# Patient Record
Sex: Female | Born: 1965 | Race: White | Hispanic: No | Marital: Single | Smoking: Former smoker
Health system: Southern US, Community
[De-identification: ages and names within clinical notes are randomized; demographics above are authoritative.]

## PROBLEM LIST (undated history)

## (undated) DIAGNOSIS — M419 Scoliosis, unspecified: Secondary | ICD-10-CM

## (undated) DIAGNOSIS — F1010 Alcohol abuse, uncomplicated: Secondary | ICD-10-CM

## (undated) HISTORY — DX: Scoliosis, unspecified: M41.9

## (undated) HISTORY — PX: SPINAL FUSION: SHX223

## (undated) HISTORY — DX: Alcohol abuse, uncomplicated: F10.10

## (undated) HISTORY — PX: THERAPEUTIC ABORTION: SHX798

---

## 1997-04-11 ENCOUNTER — Ambulatory Visit (HOSPITAL_COMMUNITY): Admission: RE | Admit: 1997-04-11 | Discharge: 1997-04-11 | Payer: Self-pay | Admitting: Gynecology

## 1997-04-20 ENCOUNTER — Inpatient Hospital Stay (HOSPITAL_COMMUNITY): Admission: AD | Admit: 1997-04-20 | Discharge: 1997-04-23 | Payer: Self-pay | Admitting: Gynecology

## 1998-05-06 ENCOUNTER — Other Ambulatory Visit: Admission: RE | Admit: 1998-05-06 | Discharge: 1998-05-06 | Payer: Self-pay | Admitting: Gynecology

## 1999-06-09 ENCOUNTER — Other Ambulatory Visit: Admission: RE | Admit: 1999-06-09 | Discharge: 1999-06-09 | Payer: Self-pay | Admitting: Gynecology

## 2001-03-15 ENCOUNTER — Other Ambulatory Visit: Admission: RE | Admit: 2001-03-15 | Discharge: 2001-03-15 | Payer: Self-pay | Admitting: Gynecology

## 2001-08-09 ENCOUNTER — Encounter (INDEPENDENT_AMBULATORY_CARE_PROVIDER_SITE_OTHER): Payer: Self-pay | Admitting: *Deleted

## 2001-08-09 ENCOUNTER — Ambulatory Visit (HOSPITAL_COMMUNITY): Admission: RE | Admit: 2001-08-09 | Discharge: 2001-08-09 | Payer: Self-pay | Admitting: Gynecology

## 2002-12-04 ENCOUNTER — Encounter: Admission: RE | Admit: 2002-12-04 | Discharge: 2002-12-04 | Payer: Self-pay | Admitting: Gynecology

## 2004-05-16 ENCOUNTER — Emergency Department (HOSPITAL_COMMUNITY): Admission: EM | Admit: 2004-05-16 | Discharge: 2004-05-16 | Payer: Self-pay | Admitting: Family Medicine

## 2005-09-16 ENCOUNTER — Other Ambulatory Visit: Admission: RE | Admit: 2005-09-16 | Discharge: 2005-09-16 | Payer: Self-pay | Admitting: Gynecology

## 2006-10-04 ENCOUNTER — Emergency Department (HOSPITAL_COMMUNITY): Admission: EM | Admit: 2006-10-04 | Discharge: 2006-10-05 | Payer: Self-pay | Admitting: Emergency Medicine

## 2006-10-05 ENCOUNTER — Inpatient Hospital Stay (HOSPITAL_COMMUNITY): Admission: AD | Admit: 2006-10-05 | Discharge: 2006-10-10 | Payer: Self-pay | Admitting: Psychiatry

## 2006-10-05 ENCOUNTER — Ambulatory Visit: Payer: Self-pay | Admitting: Psychiatry

## 2006-12-30 ENCOUNTER — Emergency Department (HOSPITAL_COMMUNITY): Admission: EM | Admit: 2006-12-30 | Discharge: 2006-12-31 | Payer: Self-pay | Admitting: Emergency Medicine

## 2010-05-21 NOTE — Discharge Summary (Signed)
Jean Palmer, Jean Palmer                 ACCOUNT NO.:  [id number]   MEDICAL RECORD NO.:  [id number]          PATIENT TYPE:  IPS   LOCATION:  0502                          FACILITY:  [facility]   PHYSICIAN:  Anselm Jungling, MD  DATE OF BIRTH:  1965/02/06   DATE OF ADMISSION:  10/05/2006  DATE OF DISCHARGE:  10/10/2006                               DISCHARGE SUMMARY   IDENTIFYING DATA/REASON FOR ADMISSION:  This was an inpatient  psychiatric admission for [name], a 45 year old Caucasian female who was  admitted to the inpatient psychiatric service due to increasing  depression, anxiety, hopelessness, and a lot of drinking.  She  described heavy maintenance-type drinking during the past month or so.  She was not involved in any treatment at the time of admission.  She  denied drug abuse.  She had never had any prior chemical dependency  treatment nor inpatient psychiatric treatment.   MEDICAL/LABORATORY:  The patient was medically and physically assessed  by the psychiatric nurse practitioner.  She was in good health without  any active or chronic medical problems.   HOSPITAL COURSE:  The patient was admitted to the adult inpatient  psychiatric service.  She presented as a well-nourished, well-developed  woman who was alert, fully oriented, pleasant, sad, tearful, and  tremulous.  There were no signs or symptoms of psychosis or thought  disorder and no active suicidal ideation.  She verbalized a strong  desire for help.   INITIAL DIAGNOSTIC IMPRESSION:  She was given an initial AXIS I  diagnosis of alcohol dependence, and substance-induced mood disorder.   The patient was involved in the therapeutic milieu and placed on a  Librium detoxification protocol, as it was felt that she was entering  alcohol withdrawal.  She participated in therapeutic groups and  activities including those geared towards 12-step recovery.  She was a  good participant in the treatment program.  Over the following  three  days, she felt better, and the Librium protocol appeared to adequately  address her withdrawal symptoms.   On the sixth hospital day, the patient appeared to be appropriate for  discharge.  We had endeavored to have a family meeting with the patient  and her father prior to discharge, but due to communication mishaps,  this was not able to occur, and the patient elected to be discharged  before it could be rescheduled.   AFTERCARE:  The patient was to follow up with Dr. Marlyne Beards with an  appointment on October 23, 2006.   DISCHARGE MEDICATIONS:  Lexapro 10 mg daily.   DISCHARGE DIAGNOSES:  AXIS I:  Alcohol dependence, early remission.  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  AXIS II:  Deferred.  AXIS III:  No acute or chronic illnesses.  AXIS IV:  Stressors:  Severe.  AXIS V:  GAF on discharge 60.      Anselm Jungling, MD  Electronically Signed     SPB/MEDQ  D:  10/12/2006  T:  10/13/2006  Job:  [id number]

## 2010-05-21 NOTE — Op Note (Signed)
NAME:  Jean Palmer, Jean Palmer                           ACCOUNT NO.:  [id number]   MEDICAL RECORD NO.:  [id number]                   PATIENT TYPE:  AMB   LOCATION:  SDC                                  FACILITY:  [facility]   PHYSICIAN:  Ivor Costa. Farrel Gobble, M.D.              DATE OF BIRTH:  Sep 29, 1965   DATE OF PROCEDURE:  08/09/2001  DATE OF DISCHARGE:                                 OPERATIVE REPORT   PREOPERATIVE DIAGNOSIS:  Undesired pregnancy.   POSTOPERATIVE DIAGNOSIS:  Undesired pregnancy.   PROCEDURE:  Second trimester dilatation and evacuation.   SURGEON:  Ivor Costa. Farrel Gobble, M.D.   ANESTHESIA:  General.   ESTIMATED BLOOD LOSS:  500 cc.   FINDINGS:  Fetal parts identified include calvarium, torso, spine, ribs,  arms, legs, hands, feet.   SPECIMENS:  Pathology was uterine contents.   DESCRIPTION OF PROCEDURE:  The patient was taken to the operating room,  where general anesthesia was induced.  She was placed in the dorsal  lithotomy position, prepped and draped in the usual sterile fashion.  A  sterile weighted speculum was placed in the vagina and the cervix was  visualized and stabilized with a single-tooth tenaculum after bimanual  examination.  Paracervical block was placed with 10 cc of 0.25% Marcaine  solution.  The cervix was then dilated up to a #51 [place], after which a 16  suction curette was advanced through the cervix and an amniotomy was  performed, after which the __________  clamp was advanced through the cervix  and the infant was delivered in a stepwise fashion with careful attention to  identifying parts.  In between, the suction was used to bring the infant  into the lower segment, and the uterus was cleared of all its contents.  Sharp curettage was performed, which revealed a scant amount of what may  have been placental tissue.  The suction was repeatedly advanced back  through the cervix to the point where we no longer had any return.  The  patient then received  Methergine, the uterus clamped down, and there was no  evidence of bleeding after the procedure.  The uterus was approximately 10  weeks' size at the end of the procedure.  All instruments were removed from  the vagina.  Again we re-identified all the parts.  The contents from  suction will also be sent to pathology.  The patient tolerated the procedure  well with sponge, lap, and needle counts correct x2.  She was transferred to  the PACU in stable condition.                                               Ivor Costa. Farrel Gobble, M.D.    Leda Roys  D:  08/09/2001  T:  08/11/2001  Job:  [id number]

## 2010-05-21 NOTE — H&P (Signed)
NAME:  Jean Palmer, Jean Palmer                           ACCOUNT NO.:  [id number]   MEDICAL RECORD NO.:  [id number]                   PATIENT TYPE:  AMB   LOCATION:  SDC                                  [facility]   PHYSICIAN:  Ivor Costa. Farrel Gobble, M.D.              DATE OF BIRTH:  [birth year]   DATE OF ADMISSION:  08/09/2001  DATE OF DISCHARGE:                                HISTORY & PHYSICAL   CHIEF COMPLAINT:  Undesired pregnancy.   HISTORY OF PRESENT ILLNESS:  The patient is a 45 year old G2, P1 with a long-  standing history of oligomenorrhea potentially going close to a year without  a period.  Based on that we had recommended that the patient have an  endometrial biopsy for history of long-standing amenorrhea.  She had stopped  her birth control pills.  They were using condoms and withdrawal for  contraception.  Her husband is planning on getting a vasectomy.  Her LMP was  February 2003.  Prior to getting an endometrial biopsy a urine pregnancy  test was obtained and subsequently came back positive.  The patient had  underwent an ultrasound to date the pregnancy as her uterus felt 14-16 weeks  size and fetal heart tones were auscultated.  The ultrasound showed a 16  week pregnancy with a due date of January 15, 2002, notable for a marginal  previa.  The patient and her husband strongly do not desire this pregnancy.  She has taken multiple medications including diet pills and ibuprofen  throughout the past six months.  In addition, she has resumed smoking and  has drank alcohol.  The patient is reassured that despite these exposures  her risk for any malformation while higher than baseline would still be  relatively small.  However, they elect for termination.   PAST OB/GYN HISTORY:  Severe oligomenorrhea, normal Pap smear.  Conceived  one pregnancy on Clomid stimulation.   PAST MEDICAL HISTORY:  Unremarkable.   PAST SURGICAL HISTORY:  Significant for spinal fusion secondary to  scoliosis.   MEDICATIONS:  Wellbutrin, diet pills.   ALLERGIES:  None.   SOCIAL HISTORY:  Married.  Social alcohol.  Social tobacco.  Regular  exercise.   PHYSICAL EXAMINATION:  GENERAL:  Female markedly upset, but no acute  distress.  HEART:  Regular rate.  LUNGS:  Clear to auscultation.  ABDOMEN:  Soft, nontender.  PELVIC:  The uterus is U -3.  Fetal heart tones were obtained.  Speculum  examination:  Normal appearing cervix.  Bimanual examination:  The uterus  was consistent with 14-16 weeks size.  Adnexal are not palpable.    ASSESSMENT:  A 44 week intrauterine pregnancy for elective termination.  Risks of the procedure were discussed with the patient including the fact  that she would be at increased risk for intrapartum and postpartum bleeding  secondary to marginal previa, increased risk of infection, uterine  perforation, or rupture possibly requiring exploratory surgery.  All  questions were addressed and the patient will present electively on August  7.                                               Ivor Costa. Farrel Gobble, M.D.    Leda Roys  D:  08/08/2001  T:  08/10/2001  Job:  [id number]

## 2010-10-08 LAB — DIFFERENTIAL
Basophils Absolute: 0
Basophils Relative: 0
Eosinophils Absolute: 0
Eosinophils Relative: 0
Lymphocytes Relative: 14
Lymphs Abs: 1.6
Monocytes Absolute: 0.7
Monocytes Relative: 6
Neutro Abs: 9.7 — ABNORMAL HIGH
Neutrophils Relative %: 81 — ABNORMAL HIGH

## 2010-10-08 LAB — URINALYSIS, ROUTINE W REFLEX MICROSCOPIC
Bilirubin Urine: NEGATIVE
Glucose, UA: NEGATIVE
Hgb urine dipstick: NEGATIVE
Ketones, ur: NEGATIVE
Nitrite: NEGATIVE
Protein, ur: NEGATIVE
Specific Gravity, Urine: 1.002 — ABNORMAL LOW
Urobilinogen, UA: 0.2
pH: 6.5

## 2010-10-08 LAB — CBC
HCT: 35.9 — ABNORMAL LOW
Hemoglobin: 12.6
MCHC: 35.1
MCV: 100.8 — ABNORMAL HIGH
Platelets: 304
RBC: 3.56 — ABNORMAL LOW
RDW: 14.3
WBC: 12 — ABNORMAL HIGH

## 2010-10-08 LAB — COMPREHENSIVE METABOLIC PANEL
ALT: 32
AST: 42 — ABNORMAL HIGH
Albumin: 3.8
Alkaline Phosphatase: 47
BUN: 4 — ABNORMAL LOW
CO2: 27
Calcium: 10.3
Chloride: 101
Creatinine, Ser: 0.6
GFR calc Af Amer: >60
GFR calc non Af Amer: >60
Glucose, Bld: 105 — ABNORMAL HIGH
Potassium: 3.9
Sodium: 137
Total Bilirubin: 0.8
Total Protein: 6.7

## 2010-10-08 LAB — PREGNANCY, URINE: Preg Test, Ur: NEGATIVE

## 2010-10-08 LAB — ETHANOL: Alcohol, Ethyl (B): <5

## 2010-10-14 LAB — CBC
HCT: 39.3
Hemoglobin: 13.5
MCHC: 34.4
MCV: 100.2 — ABNORMAL HIGH
Platelets: 308
RBC: 3.92
RDW: 11.9
WBC: 9.4

## 2010-10-14 LAB — RAPID URINE DRUG SCREEN, HOSP PERFORMED
Amphetamines: NOT DETECTED
Barbiturates: NOT DETECTED
Benzodiazepines: NOT DETECTED
Cocaine: NOT DETECTED
Opiates: NOT DETECTED
Tetrahydrocannabinol: NOT DETECTED

## 2010-10-14 LAB — BASIC METABOLIC PANEL
BUN: 7
CO2: 29
Calcium: 9.2
Chloride: 101
Creatinine, Ser: 0.59
GFR calc Af Amer: >60
GFR calc non Af Amer: >60
Glucose, Bld: 90
Potassium: 4.5
Sodium: 139

## 2010-10-14 LAB — DIFFERENTIAL
Basophils Absolute: 0.1
Basophils Relative: 1
Eosinophils Absolute: 0.1
Eosinophils Relative: 1
Lymphocytes Relative: 43
Lymphs Abs: 4 — ABNORMAL HIGH
Monocytes Absolute: 0.5
Monocytes Relative: 5
Neutro Abs: 4.8
Neutrophils Relative %: 50

## 2010-10-14 LAB — TSH: TSH: 1.639

## 2010-10-14 LAB — HEPATIC FUNCTION PANEL
ALT: 27
AST: 32
Albumin: 3.6
Alkaline Phosphatase: 57
Bilirubin, Direct: 0.1
Indirect Bilirubin: 0.8
Total Bilirubin: 0.9
Total Protein: 6.6

## 2010-10-14 LAB — ETHANOL: Alcohol, Ethyl (B): 141 — ABNORMAL HIGH

## 2010-10-14 LAB — PREGNANCY, URINE: Preg Test, Ur: NEGATIVE

## 2019-01-08 ENCOUNTER — Encounter: Payer: Self-pay | Admitting: Gastroenterology

## 2019-01-14 ENCOUNTER — Ambulatory Visit (INDEPENDENT_AMBULATORY_CARE_PROVIDER_SITE_OTHER): Payer: 59

## 2019-01-14 ENCOUNTER — Other Ambulatory Visit: Payer: Self-pay

## 2019-01-14 ENCOUNTER — Encounter: Payer: Self-pay | Admitting: Internal Medicine

## 2019-01-14 ENCOUNTER — Ambulatory Visit (INDEPENDENT_AMBULATORY_CARE_PROVIDER_SITE_OTHER): Payer: 59 | Admitting: Internal Medicine

## 2019-01-14 VITALS — BP 135/86 | HR 93 | Temp 97.2°F | Resp 17 | Ht 62.0 in | Wt 165.0 lb

## 2019-01-14 DIAGNOSIS — R05 Cough: Secondary | ICD-10-CM | POA: Diagnosis not present

## 2019-01-14 DIAGNOSIS — F17200 Nicotine dependence, unspecified, uncomplicated: Secondary | ICD-10-CM

## 2019-01-14 DIAGNOSIS — F17210 Nicotine dependence, cigarettes, uncomplicated: Secondary | ICD-10-CM

## 2019-01-14 DIAGNOSIS — N6324 Unspecified lump in the left breast, lower inner quadrant: Secondary | ICD-10-CM | POA: Diagnosis not present

## 2019-01-14 DIAGNOSIS — N644 Mastodynia: Secondary | ICD-10-CM

## 2019-01-14 DIAGNOSIS — R053 Chronic cough: Secondary | ICD-10-CM

## 2019-01-14 DIAGNOSIS — Z7689 Persons encountering health services in other specified circumstances: Secondary | ICD-10-CM

## 2019-01-14 DIAGNOSIS — Z1231 Encounter for screening mammogram for malignant neoplasm of breast: Secondary | ICD-10-CM

## 2019-01-14 DIAGNOSIS — Z13228 Encounter for screening for other metabolic disorders: Secondary | ICD-10-CM

## 2019-01-14 NOTE — Progress Notes (Signed)
Subjective:    Jean Palmer - 54 y.o. female MRN [id number]  Date of birth: [birth year]  HPI  Jean Palmer is to establish care. Patient has a PMH significant for h/o scoliosis s/p spinal fusion, alcohol abuse--sober for 11 years, tobacco use disorder. Concerned today about left breast tenderness and chronic cough.   Left Breast Tenderness:  Has been occurring for several months. Notices the tenderness in the lower inner quadrant. Nothing seems to make it worse or better. Is not chronic, occurs occasionally. Performs self breast exams and has been concerned about a lump in the area she has tenderness. Has had mammogram in the past which was normal but it has been many years. No known family history of breast cancer.   Chronic Cough: Endorses chronic cough that seems to occur most mornings when first wakes up. This has been occurring for more than 6 months. Is a current smoker, 1/2 ppd. Denies hemoptysis/SOB/wheezing. Her adopted mother passed away from lung cancer and she is very concerned.     Social History   reports that she has been smoking cigarettes. She has been smoking about 0.50 packs per day. She has never used smokeless tobacco.   Family History  She was adopted. Family history is unknown by patient.   Health Maintenance Due  Topic Date Due  . HIV Screening  06/16/1981  . TETANUS/TDAP  06/16/1985  . PAP SMEAR-Modifier  06/17/1987  . MAMMOGRAM  06/16/2016  . COLONOSCOPY  06/16/2016     Review of Systems  Constitutional: Negative for chills, fever, malaise/fatigue and weight loss.  HENT: Negative for congestion, ear pain and sore throat.   Eyes: Negative for blurred vision, discharge and redness.  Respiratory: Positive for cough. Negative for hemoptysis, sputum production, shortness of breath and wheezing.   Cardiovascular: Negative for chest pain, palpitations and leg swelling.  Gastrointestinal: Negative for abdominal pain, constipation, diarrhea, nausea and vomiting.   Genitourinary: Negative for dysuria, frequency, hematuria and urgency.  Musculoskeletal: Negative for back pain and myalgias.  Skin: Negative for rash.  Neurological: Negative for dizziness, weakness and headaches.  Psychiatric/Behavioral: Negative for depression and suicidal ideas. The patient is not nervous/anxious.   All other systems reviewed and are negative.     Past Medical History: Patient Active Problem List   Diagnosis Date Noted  . Tobacco use disorder 01/14/2019    Medications: reviewed and updated   Objective:   Physical Exam BP 135/86   Pulse 93   Temp (!) 97.2 F (36.2 C) (Temporal)   Resp 17   Ht 5\' 2"  (1.575 m)   Wt 165 lb (74.8 kg)   SpO2 98%   BMI 30.18 kg/m  Physical Exam  Constitutional: She is oriented to person, place, and time and well-developed, well-nourished, and in no distress.  HENT:  Head: Normocephalic and atraumatic.  Mouth/Throat: Oropharynx is clear and moist.  Eyes: Pupils are equal, round, and reactive to light. Conjunctivae and EOM are normal.  Neck: No thyromegaly present.  Cardiovascular: Normal rate, regular rhythm, normal heart sounds and intact distal pulses.  No murmur heard. Pulmonary/Chest: Effort normal and breath sounds normal. No respiratory distress. She has no wheezes.  Breasts feel fibrocystic and dense throughout bilaterally. Able to palpate a discrete pea sized nodule in the left inner lower quadrant. The nodule is not fixed and easily mobile. No skin discoloration of the breasts. Nipples appear normal without discharge.   Abdominal: Soft. Bowel sounds are normal. She exhibits no distension. There is  no abdominal tenderness. There is no rebound and no guarding.  Musculoskeletal:        General: No deformity or edema. Normal range of motion.     Cervical back: Normal range of motion and neck supple.  Lymphadenopathy:    She has no cervical adenopathy.  Neurological: She is alert and oriented to person, place, and  time. Gait normal.  Skin: Skin is warm and dry. No rash noted. She is not diaphoretic.  Psychiatric: Mood, affect and judgment normal.  Nursing note and vitals reviewed.       Assessment & Plan:   1. Encounter to establish care Counseled on 150 minutes of exercise per week, healthy eating (including decreased daily intake of saturated fats, cholesterol, added sugars, sodium), STI prevention, routine healthcare maintenance. - HIV antibody (with reflex) -has an appointment for colonoscopy in early Feb  -declines Flu/Tdap today   2. Encounter for screening mammogram for malignant neoplasm of breast Needs mammogram for screening due to age and to follow up on left breast concerns.  - MM Digital Screening; Future  3. Chronic cough Lungs CTAB. Given chronic cough and daily smoker >30 years, will obtain screening chest x-ray. Since cough occurs in the morning, potential this is related to silent reflux. Would consider PPI if CXR negative and cough bothersome to patient.  - DG Chest 2 View  4. Tobacco use disorder Patient currently smokes 1/2 ppd. Spent 5 minutes counseling on dangers of tobacco use and benefits of quitting, offered pharmacological intervention to aid quitting and patient is not ready to quit. Father recently passed away, thinks she will be ready to quit later this year.  - DG Chest 2 View  5. Screening for metabolic disorder - Comprehensive metabolic panel - Lipid Panel     Phill Myron, D.O. 01/14/2019, 4:05 PM [facility] at [facility]

## 2019-01-14 NOTE — Patient Instructions (Signed)
Thanks so much for establishing care today! It was great to meet you!   I have ordered your mammogram. [facility] will receive a call from [facility] to have this scheduled.   Today we got a chest x-ray and some blood work for screening. We will let you know these results in the next 2-3 days.   Take Care,  Dr. Juleen China

## 2019-01-15 LAB — COMPREHENSIVE METABOLIC PANEL
ALT: 29 IU/L (ref 0–32)
AST: 32 IU/L (ref 0–40)
Albumin/Globulin Ratio: 1.6 (ref 1.2–2.2)
Albumin: 4.2 g/dL (ref 3.8–4.9)
Alkaline Phosphatase: 68 IU/L (ref 39–117)
BUN/Creatinine Ratio: 12 (ref 9–23)
BUN: 8 mg/dL (ref 6–24)
Bilirubin Total: 0.2 mg/dL (ref 0.0–1.2)
CO2: 25 mmol/L (ref 20–29)
Calcium: 9.9 mg/dL (ref 8.7–10.2)
Chloride: 104 mmol/L (ref 96–106)
Creatinine, Ser: 0.66 mg/dL (ref 0.57–1.00)
GFR calc Af Amer: 117 mL/min/{1.73_m2} (ref >59)
GFR calc non Af Amer: 102 mL/min/{1.73_m2} (ref >59)
Globulin, Total: 2.7 g/dL (ref 1.5–4.5)
Glucose: 93 mg/dL (ref 65–99)
Potassium: 4.1 mmol/L (ref 3.5–5.2)
Sodium: 141 mmol/L (ref 134–144)
Total Protein: 6.9 g/dL (ref 6.0–8.5)

## 2019-01-15 LAB — LIPID PANEL
Chol/HDL Ratio: 4.6 ratio — ABNORMAL HIGH (ref 0.0–4.4)
Cholesterol, Total: 234 mg/dL — ABNORMAL HIGH (ref 100–199)
HDL: 51 mg/dL (ref >39)
LDL Chol Calc (NIH): 158 mg/dL — ABNORMAL HIGH (ref 0–99)
Triglycerides: 139 mg/dL (ref 0–149)
VLDL Cholesterol Cal: 25 mg/dL (ref 5–40)

## 2019-01-15 LAB — HIV ANTIBODY (ROUTINE TESTING W REFLEX): HIV Screen 4th Generation wRfx: NONREACTIVE

## 2019-01-15 NOTE — Progress Notes (Signed)
Patient notified of results & recommendations. Expressed understanding.

## 2019-01-17 ENCOUNTER — Telehealth: Payer: Self-pay | Admitting: General Practice

## 2019-01-18 ENCOUNTER — Other Ambulatory Visit: Payer: Self-pay | Admitting: Internal Medicine

## 2019-01-18 DIAGNOSIS — N6320 Unspecified lump in the left breast, unspecified quadrant: Secondary | ICD-10-CM

## 2019-01-22 ENCOUNTER — Other Ambulatory Visit: Payer: Self-pay

## 2019-01-22 ENCOUNTER — Ambulatory Visit
Admission: RE | Admit: 2019-01-22 | Discharge: 2019-01-22 | Disposition: A | Discharge status: Home / Self Care | Payer: 59 | Source: Ambulatory Visit | Attending: Internal Medicine | Admitting: Internal Medicine

## 2019-01-22 DIAGNOSIS — N6320 Unspecified lump in the left breast, unspecified quadrant: Secondary | ICD-10-CM

## 2019-02-13 ENCOUNTER — Other Ambulatory Visit: Payer: Self-pay

## 2019-02-13 ENCOUNTER — Ambulatory Visit (AMBULATORY_SURGERY_CENTER): Payer: Self-pay | Admitting: *Deleted

## 2019-02-13 VITALS — Temp 97.7°F | Ht 62.0 in | Wt 132.0 lb

## 2019-02-13 DIAGNOSIS — Z1211 Encounter for screening for malignant neoplasm of colon: Secondary | ICD-10-CM

## 2019-02-13 DIAGNOSIS — Z01818 Encounter for other preprocedural examination: Secondary | ICD-10-CM

## 2019-02-13 MED ORDER — NA SULFATE-K SULFATE-MG SULF 17.5-3.13-1.6 GM/177ML PO SOLN: 1.0000 | Freq: Once | ORAL | 0 refills | Status: AC

## 2019-02-13 NOTE — Progress Notes (Signed)

## 2019-02-20 ENCOUNTER — Other Ambulatory Visit: Payer: Self-pay | Admitting: Gastroenterology

## 2019-02-22 ENCOUNTER — Ambulatory Visit: Payer: 59 | Admitting: Internal Medicine

## 2019-02-22 ENCOUNTER — Other Ambulatory Visit: Payer: Self-pay | Admitting: Gastroenterology

## 2019-02-22 ENCOUNTER — Telehealth: Payer: Self-pay | Admitting: *Deleted

## 2019-02-22 LAB — SARS CORONAVIRUS 2 (TAT 6-24 HRS): SARS Coronavirus 2: INVALID

## 2019-02-22 NOTE — Telephone Encounter (Signed)
Called pt. To inform her that she needed to be rechecked for covid,she agreed to go and have test redone.

## 2019-02-25 ENCOUNTER — Other Ambulatory Visit: Payer: Self-pay

## 2019-02-25 ENCOUNTER — Encounter: Payer: Self-pay | Admitting: Gastroenterology

## 2019-02-25 ENCOUNTER — Ambulatory Visit (AMBULATORY_SURGERY_CENTER): Payer: 59 | Admitting: Gastroenterology

## 2019-02-25 VITALS — BP 109/66 | HR 72 | Temp 96.6°F | Resp 14 | Ht 62.0 in | Wt 132.0 lb

## 2019-02-25 DIAGNOSIS — K635 Polyp of colon: Secondary | ICD-10-CM | POA: Diagnosis not present

## 2019-02-25 DIAGNOSIS — Z1211 Encounter for screening for malignant neoplasm of colon: Secondary | ICD-10-CM

## 2019-02-25 DIAGNOSIS — D125 Benign neoplasm of sigmoid colon: Secondary | ICD-10-CM

## 2019-02-25 DIAGNOSIS — D123 Benign neoplasm of transverse colon: Secondary | ICD-10-CM

## 2019-02-25 MED ORDER — SODIUM CHLORIDE 0.9 % IV SOLN: 500.0000 mL | Freq: Once | INTRAVENOUS | Status: DC

## 2019-02-25 NOTE — Op Note (Signed)
[facility] Patient Name: In Shidler Procedure Date: 02/25/2019 10:41 AM MRN: [id number] Endoscopist: Mauri Pole , MD Age: 54 Referring MD:  Date of Birth: 19-Aug-1965 Gender: Female Account #: [id number] Procedure:                Colonoscopy Indications:              Screening for colorectal malignant neoplasm Medicines:                Monitored Anesthesia Care Procedure:                Pre-Anesthesia Assessment:                           - Prior to the procedure, a History and Physical                            was performed, and patient medications and                            allergies were reviewed. The patient's tolerance of                            previous anesthesia was also reviewed. The risks                            and benefits of the procedure and the sedation                            options and risks were discussed with the patient.                            All questions were answered, and informed consent                            was obtained. Prior Anticoagulants: The patient has                            taken no previous anticoagulant or antiplatelet                            agents. ASA Grade Assessment: II - A patient with                            mild systemic disease. After reviewing the risks                            and benefits, the patient was deemed in                            satisfactory condition to undergo the procedure.                           After obtaining informed consent, the colonoscope  was passed under direct vision. Throughout the                            procedure, the patient's blood pressure, pulse, and                            oxygen saturations were monitored continuously. The                            Colonoscope was introduced through the anus and                            advanced to the the cecum, identified by                            appendiceal orifice and  ileocecal valve. The                            colonoscopy was performed without difficulty. The                            patient tolerated the procedure well. The quality                            of the bowel preparation was excellent. The                            ileocecal valve, appendiceal orifice, and rectum                            were photographed. Scope In: 10:44:45 AM Scope Out: 11:05:13 AM Scope Withdrawal Time: 0 hours 14 minutes 5 seconds  Total Procedure Duration: 0 hours 20 minutes 28 seconds  Findings:                 The perianal and digital rectal examinations were                            normal.                           Two sessile polyps were found in the sigmoid colon                            and transverse colon. The polyps were 4 to 6 mm in                            size. These polyps were removed with a cold snare.                            Resection and retrieval were complete.                           Two sessile polyps were found in the sigmoid colon  and transverse colon. The polyps were 1 to 2 mm in                            size. These polyps were removed with a cold biopsy                            forceps. Resection and retrieval were complete.                           Non-bleeding internal hemorrhoids were found during                            retroflexion. The hemorrhoids were small. Complications:            No immediate complications. Estimated Blood Loss:     Estimated blood loss was minimal. Impression:               - Two 4 to 6 mm polyps in the sigmoid colon and in                            the transverse colon, removed with a cold snare.                            Resected and retrieved.                           - Two 1 to 2 mm polyps in the sigmoid colon and in                            the transverse colon, removed with a cold biopsy                            forceps. Resected and retrieved.                            - Non-bleeding internal hemorrhoids. Recommendation:           - Patient has a contact number available for                            emergencies. The signs and symptoms of potential                            delayed complications were discussed with the                            patient. Return to normal activities tomorrow.                            Written discharge instructions were provided to the                            patient.                           -  Resume previous diet.                           - Continue present medications.                           - Await pathology results.                           - Repeat colonoscopy in 3 - 5 years for                            surveillance based on pathology results. Mauri Pole, MD 02/25/2019 11:09:24 AM This report has been signed electronically.

## 2019-02-25 NOTE — Patient Instructions (Signed)
Please read handouts provided. Continue present medications. Await pathology results.        YOU HAD AN ENDOSCOPIC PROCEDURE TODAY AT THE [facility]:   Refer to the procedure report that was given to you for any specific questions about what was found during the examination.  If the procedure report does not answer your questions, please call your gastroenterologist to clarify.  If you requested that your care partner not be given the details of your procedure findings, then the procedure report has been included in a sealed envelope for you to review at your convenience later.  YOU SHOULD EXPECT: Some feelings of bloating in the abdomen. Passage of more gas than usual.  Walking can help get rid of the air that was put into your GI tract during the procedure and reduce the bloating. If you had a lower endoscopy (such as a colonoscopy or flexible sigmoidoscopy) you may notice spotting of blood in your stool or on the toilet paper. If you underwent a bowel prep for your procedure, you may not have a normal bowel movement for a few days.  Please Note:  You might notice some irritation and congestion in your nose or some drainage.  This is from the oxygen used during your procedure.  There is no need for concern and it should clear up in a day or so.  SYMPTOMS TO REPORT IMMEDIATELY:   Following lower endoscopy (colonoscopy or flexible sigmoidoscopy):  Excessive amounts of blood in the stool  Significant tenderness or worsening of abdominal pains  Swelling of the abdomen that is new, acute  Fever of 100F or higher    For urgent or emergent issues, a gastroenterologist can be reached at any hour by calling [phone].   DIET:  We do recommend a small meal at first, but then you may proceed to your regular diet.  Drink plenty of fluids but you should avoid alcoholic beverages for 24 hours.  ACTIVITY:  You should plan to take it easy for the rest of today and you should NOT  DRIVE or use heavy machinery until tomorrow (because of the sedation medicines used during the test).    FOLLOW UP: Our staff will call the number listed on your records 48-72 hours following your procedure to check on you and address any questions or concerns that you may have regarding the information given to you following your procedure. If we do not reach you, we will leave a message.  We will attempt to reach you two times.  During this call, we will ask if you have developed any symptoms of COVID 19. If you develop any symptoms (ie: fever, flu-like symptoms, shortness of breath, cough etc.) before then, please call [phone].  If you test positive for Covid 19 in the 2 weeks post procedure, please call and report this information to us.    If any biopsies were taken you will be contacted by phone or by letter within the next 1-3 weeks.  Please call us at [phone] if you have not heard about the biopsies in 3 weeks.    SIGNATURES/CONFIDENTIALITY: You and/or your care partner have signed paperwork which will be entered into your electronic medical record.  These signatures attest to the fact that that the information above on your After Visit Summary has been reviewed and is understood.  Full responsibility of the confidentiality of this discharge information lies with you and/or your care-partner. 

## 2019-02-25 NOTE — Progress Notes (Signed)
Pt's states no medical or surgical changes since previsit or office visit.  Temp taken by JR VS taken by DT

## 2019-02-25 NOTE — Progress Notes (Signed)
PT taken to PACU. Monitors in place. VSS. Report given to RN. 

## 2019-02-25 NOTE — Progress Notes (Signed)
Called to room to assist during endoscopic procedure.  Patient ID and intended procedure confirmed with present staff. Received instructions for my participation in the procedure from the performing physician.  

## 2019-02-26 LAB — SARS CORONAVIRUS 2 (TAT 6-24 HRS): SARS Coronavirus 2: NEGATIVE

## 2019-02-27 ENCOUNTER — Telehealth: Payer: Self-pay | Admitting: *Deleted

## 2019-02-27 NOTE — Telephone Encounter (Signed)
  Follow up Call-  Call back number 02/25/2019  Post procedure Call Back phone  # [phone]  Permission to leave phone message Yes  Some recent data might be hidden     Patient questions:  Do you have a fever, pain , or abdominal swelling? No. Pain Score  0 *  Have you tolerated food without any problems? Yes.    Have you been able to return to your normal activities? Yes.    Do you have any questions about your discharge instructions: Diet   No. Medications  No. Follow up visit  No.  Do you have questions or concerns about your Care? No.  Actions: * If pain score is 4 or above: No action needed, pain <4.  1. Have you developed a fever since your procedure? NO  2.   Have you had an respiratory symptoms (SOB or cough) since your procedure? NO  3.   Have you tested positive for COVID 19 since your procedure NO  4.   Have you had any family members/close contacts diagnosed with the COVID 19 since your procedure?  NO   If yes to any of these questions please route to Joylene John, RN and Alphonsa Gin, RN.

## 2019-03-05 ENCOUNTER — Telehealth: Payer: Self-pay

## 2019-03-05 NOTE — Telephone Encounter (Signed)

## 2019-03-06 ENCOUNTER — Other Ambulatory Visit: Payer: Self-pay

## 2019-03-06 ENCOUNTER — Other Ambulatory Visit (HOSPITAL_COMMUNITY)
Admission: RE | Admit: 2019-03-06 | Discharge: 2019-03-06 | Disposition: A | Discharge status: Home / Self Care | Payer: 59 | Source: Ambulatory Visit | Attending: Internal Medicine | Admitting: Internal Medicine

## 2019-03-06 ENCOUNTER — Encounter: Payer: Self-pay | Admitting: Internal Medicine

## 2019-03-06 ENCOUNTER — Ambulatory Visit (INDEPENDENT_AMBULATORY_CARE_PROVIDER_SITE_OTHER): Payer: 59 | Admitting: Internal Medicine

## 2019-03-06 VITALS — BP 115/78 | HR 91 | Temp 98.2°F | Resp 17 | Ht 62.0 in | Wt 135.0 lb

## 2019-03-06 DIAGNOSIS — Z113 Encounter for screening for infections with a predominantly sexual mode of transmission: Secondary | ICD-10-CM

## 2019-03-06 DIAGNOSIS — Z975 Presence of (intrauterine) contraceptive device: Secondary | ICD-10-CM | POA: Diagnosis not present

## 2019-03-06 DIAGNOSIS — Z780 Asymptomatic menopausal state: Secondary | ICD-10-CM | POA: Insufficient documentation

## 2019-03-06 DIAGNOSIS — Z124 Encounter for screening for malignant neoplasm of cervix: Secondary | ICD-10-CM | POA: Insufficient documentation

## 2019-03-06 DIAGNOSIS — Z1151 Encounter for screening for human papillomavirus (HPV): Secondary | ICD-10-CM | POA: Insufficient documentation

## 2019-03-06 DIAGNOSIS — E785 Hyperlipidemia, unspecified: Secondary | ICD-10-CM | POA: Diagnosis not present

## 2019-03-06 NOTE — Progress Notes (Signed)
  Subjective:    Jean Palmer - 54 y.o. female MRN [id number]  Date of birth: 1965-02-17  HPI  Jean Palmer is here for PAP smear. She reports history of abnormal PAP in past with cryotherapy done. Has not had a PAP for several years. Also reports she had a Mirena IUD put in around 2006 for contraception and never had it removed. Denies vaginal discharge, pelvic pain.      Health Maintenance:  Health Maintenance Due  Topic Date Due  . PAP SMEAR-Modifier  06/16/1996    -  reports that she has been smoking cigarettes. She has been smoking about 0.50 packs per day. She has never used smokeless tobacco. - Review of Systems: Per HPI. - Past Medical History: Patient Active Problem List   Diagnosis Date Noted  . Tobacco use disorder 01/14/2019   - Medications: reviewed and updated   Objective:   Physical Exam BP 115/78   Pulse 91   Temp 98.2 F (36.8 C) (Temporal)   Resp 17   Ht 5\' 2"  (1.575 m)   Wt 135 lb (61.2 kg)   SpO2 98%   BMI 24.69 kg/m  Physical Exam  Constitutional: She is oriented to person, place, and time and well-developed, well-nourished, and in no distress. No distress.  Cardiovascular: Normal rate.  Pulmonary/Chest: Effort normal. No respiratory distress.  Genitourinary:    Genitourinary Comments: Exam performed in the presence of a chaperone. External genitalia within normal limits.  Vaginal mucosa pink, moist, normal rugae.  Nonfriable cervix without lesions, no discharge or bleeding noted on speculum exam. No IUD strings visualized. Bimanual exam revealed normal, nongravid uterus.  No cervical motion tenderness. No adnexal masses bilaterally.      Musculoskeletal:        General: Normal range of motion.  Neurological: She is alert and oriented to person, place, and time.  Skin: Skin is warm and dry. She is not diaphoretic.  Psychiatric: Affect and judgment normal.           Assessment & Plan:   1. Pap smear for cervical cancer screening -  Cytology - PAP([facility])  2. IUD (intrauterine device) in place Placed around 2006 per patient report. Unable to visualize strings on exam. Will obtain imaging.  - US PELVIC COMPLETE WITH TRANSVAGINAL; Future  3. Screening for STD (sexually transmitted disease) Declines HIV/RPR.  - Cervicovaginal ancillary only  4. Hyperlipidemia, unspecified hyperlipidemia type Total cholesterol 234. LDL 158. ASCVD risk is 5.8%. Discussed risks and benefits for statin therapy. Advised would lower risk to 4.3% and I do not feel strongly one way or there other about medication with that data. Patient would like to try diet and exercise changes to improve labs prior to initiating medication.    Phill Myron, D.O. 03/06/2019, 11:23 AM Primary Care at [facility]

## 2019-03-06 NOTE — Patient Instructions (Addendum)
The number to central scheduling is [phone]  Pap Test Why am I having this test? A Pap test, also called a Pap smear, is a screening test to check for signs of:  Cancer of the vagina, cervix, and uterus. The cervix is the lower part of the uterus that opens into the vagina.  Infection.  Changes that may be a sign that cancer is developing (precancerous changes). Women need this test on a regular basis. In general, you should have a Pap test every 3 years until you reach menopause or age 19. Women aged 30-60 may choose to have their Pap test done at the same time as an HPV (human papillomavirus) test every 5 years (instead of every 3 years). Your health care provider may recommend having Pap tests more or less often depending on your medical conditions and past Pap test results. What kind of sample is taken?  Your health care provider will collect a sample of cells from the surface of your cervix. This will be done using a small cotton swab, plastic spatula, or brush. This sample is often collected during a pelvic exam, when you are lying on your back on an exam table with feet in footrests (stirrups). In some cases, fluids (secretions) from the cervix or vagina may also be collected. How do I prepare for this test?  Be aware of where you are in your menstrual cycle. If you are menstruating on the day of the test, you may be asked to reschedule.  You may need to reschedule if you have a known vaginal infection on the day of the test.  Follow instructions from your health care provider about: ? Changing or stopping your regular medicines. Some medicines can cause abnormal test results, such as digitalis and tetracycline. ? Avoiding douching or taking a bath the day before or the day of the test. Tell a health care provider about:  Any allergies you have.  All medicines you are taking, including vitamins, herbs, eye drops, creams, and over-the-counter medicines.  Any blood disorders  you have.  Any surgeries you have had.  Any medical conditions you have.  Whether you are pregnant or may be pregnant. How are the results reported? Your test results will be reported as either abnormal or normal. A false-positive result can occur. A false positive is incorrect because it means that a condition is present when it is not. A false-negative result can occur. A false negative is incorrect because it means that a condition is not present when it is. What do the results mean? A normal test result means that you do not have signs of cancer of the vagina, cervix, or uterus. An abnormal result may mean that you have:  Cancer. A Pap test by itself is not enough to diagnose cancer. You will have more tests done in this case.  Precancerous changes in your vagina, cervix, or uterus.  Inflammation of the cervix.  An STD (sexually transmitted disease).  A fungal infection.  A parasite infection. Talk with your health care provider about what your results mean. Questions to ask your health care provider Ask your health care provider, or the department that is doing the test:  When will my results be ready?  How will I get my results?  What are my treatment options?  What other tests do I need?  What are my next steps? Summary  In general, women should have a Pap test every 3 years until they reach menopause or age 70.  Your health care provider will collect a sample of cells from the surface of your cervix. This will be done using a small cotton swab, plastic spatula, or brush.  In some cases, fluids (secretions) from the cervix or vagina may also be collected. This information is not intended to replace advice given to you by your health care provider. Make sure you discuss any questions you have with your health care provider. Document Revised: 08/29/2016 Document Reviewed: 08/29/2016 Elsevier Patient Education  [place].

## 2019-03-07 ENCOUNTER — Encounter: Payer: Self-pay | Admitting: Gastroenterology

## 2019-03-07 LAB — CERVICOVAGINAL ANCILLARY ONLY
Bacterial Vaginitis (gardnerella): POSITIVE — AB
Candida Glabrata: NEGATIVE
Candida Vaginitis: NEGATIVE
Chlamydia: NEGATIVE
Comment: NEGATIVE
Comment: NEGATIVE
Comment: NEGATIVE
Comment: NEGATIVE
Comment: NEGATIVE
Comment: NORMAL
Neisseria Gonorrhea: NEGATIVE
Trichomonas: NEGATIVE

## 2019-03-12 LAB — CYTOLOGY - PAP
Comment: NEGATIVE
Comment: NEGATIVE
Diagnosis: NEGATIVE
HPV 16: NEGATIVE
HPV 18 / 45: NEGATIVE
High risk HPV: POSITIVE — AB

## 2019-03-13 ENCOUNTER — Other Ambulatory Visit: Payer: Self-pay | Admitting: Internal Medicine

## 2019-03-13 ENCOUNTER — Encounter: Payer: Self-pay | Admitting: Internal Medicine

## 2019-03-13 DIAGNOSIS — R87810 Cervical high risk human papillomavirus (HPV) DNA test positive: Secondary | ICD-10-CM | POA: Insufficient documentation

## 2019-03-13 MED ORDER — METRONIDAZOLE 500 MG PO TABS: 500.0000 mg | ORAL_TABLET | Freq: Two times a day (BID) | ORAL | 0 refills | Status: DC

## 2019-03-15 NOTE — Progress Notes (Signed)
Patient notified of results & recommendations. Expressed understanding. Put in a recall to make the 1 year pap appointment.

## 2019-04-02 NOTE — Telephone Encounter (Signed)
error 

## 2019-06-04 ENCOUNTER — Telehealth: Payer: Self-pay | Admitting: Internal Medicine

## 2019-06-04 ENCOUNTER — Other Ambulatory Visit: Payer: Self-pay

## 2019-06-04 ENCOUNTER — Telehealth (INDEPENDENT_AMBULATORY_CARE_PROVIDER_SITE_OTHER): Payer: 59 | Admitting: Licensed Clinical Social Worker

## 2019-06-04 DIAGNOSIS — F411 Generalized anxiety disorder: Secondary | ICD-10-CM

## 2019-06-04 NOTE — Progress Notes (Signed)
Integrated Behavioral Health Visit via Telemedicine (Telephone)  06/04/2019 [name] [id number]   Session Start time: 5:05 PM  Session End time: 5:30 PM Total time: 25  Referring Provider: Dr. Juleen China Type of Visit: Telephonic Patient location: Home [facility] Provider location: Office All persons participating in visit: LCSW and Patient  Confirmed patient's address: Yes  Confirmed patient's phone number: Yes  Any changes to demographics: No   Confirmed patient's insurance: Yes  Any changes to patient's insurance: No   Discussed confidentiality: Yes    The following statements were read to the patient and/or legal guardian that are established with the [facility] Provider.  "The purpose of this phone visit is to provide behavioral health care while limiting exposure to the coronavirus (COVID19).  There is a possibility of technology failure and discussed alternative modes of communication if that failure occurs."  "By engaging in this telephone visit, you consent to the provision of healthcare.  Additionally, you authorize for your insurance to be billed for the services provided during this telephone visit."   Patient and/or legal guardian consented to telephone visit: Yes   PRESENTING CONCERNS: Patient and/or family reports the following symptoms/concerns: Pt reports increase in anxiety symptoms. Symptoms include decreased appetite, inability to sleep due to racing thoughts, and increase in irritability.  Duration of problem: Pt has been diagnosed with Anxiety in the past. States increase in symptoms began approximately 2 weeks ago; Severity of problem: severe  STRENGTHS (Protective Factors/Coping Skills): Pt has good insight Pt receives strong support from fiance Pt is open to medication management   GOALS ADDRESSED: Patient will: 1.  Reduce symptoms of: anxiety  2.  Increase knowledge and/or ability of: coping skills  3.  Demonstrate ability to: Increase healthy  adjustment to current life circumstances and Increase adequate support systems for patient/family  INTERVENTIONS: Interventions utilized:  Solution-Focused Strategies, Supportive Counseling and Psychoeducation and/or Health Education Standardized Assessments completed: GAD-7 and PHQ 2&9  ASSESSMENT: Patient currently experiencing increase in anxiety symptoms that have negatively impacted functioning. Pt has not obtained any relief from melatonin or benedryl. Reports positive results with lorezepam; however, pt shared that she is open to any medication that may provide relief. Pt has maintained sobriety from substance use for over 12 years. Denies SI/HI.   Patient may benefit from medication management and psychotherapy.  Therapeutic strategies discussed and crisis intervention resources provided. Pt prefers to receive medication management through PCP stating that it causes anxiety to initiate behavioral health services in the community at this time. This LCSW will forward note for PCP to evaluate.   PLAN: 1. Follow up with behavioral health clinician on : Contact LCSW with any additional behavioral health and/or resource needs 2. Behavioral recommendations: Utilize strategies discussed 3. Referral(s): [place] (In Clinic)  Rebekah Chesterfield, [place] 06/05/2019 11:14 AM

## 2019-06-04 NOTE — Telephone Encounter (Signed)
Patient is experiencing extreme anxiety and possibly panic attacks. Pt also states she cant sleep or focus at all and brain is scattered. This as been ongoing for several weeks. Not currently prescribed anxiety or depression medication. Urgent matter please follow up with patient soon as available.

## 2019-06-04 NOTE — Telephone Encounter (Signed)
This is something new that has not been discussed with provider. She would need an appointment with Jean Palmer prior to starting any medication. This will be a virtual appointment since her last one was in office. Please schedule appointment.  You can also offer an appointment with [facility] She is in office here on Tuesday afternoons. She does have openings today as well.

## 2019-06-05 ENCOUNTER — Ambulatory Visit (HOSPITAL_COMMUNITY): Payer: 59

## 2019-06-06 ENCOUNTER — Other Ambulatory Visit: Payer: Self-pay | Admitting: Internal Medicine

## 2019-06-06 DIAGNOSIS — F410 Panic disorder [episodic paroxysmal anxiety] without agoraphobia: Secondary | ICD-10-CM

## 2019-06-06 MED ORDER — HYDROXYZINE HCL 10 MG PO TABS: 10.0000 mg | ORAL_TABLET | Freq: Three times a day (TID) | ORAL | 0 refills | Status: DC | PRN

## 2019-06-06 NOTE — Progress Notes (Signed)
Patient seen by Christa See, LCSW on 6/1 for anxiety. Reports that she wanted to start medication management for this with me. We have scheduled her to be seen on 6/10. However, she is suffering from severe panic attacks and could benefit from a short acting anxiolytic. Will send in Rx Hydroxyzine 10 mg TID prn in the interim.    Phill Myron, D.O. Primary Care at [facility]  06/06/2019, 9:10 AM

## 2019-06-13 ENCOUNTER — Telehealth (INDEPENDENT_AMBULATORY_CARE_PROVIDER_SITE_OTHER): Payer: 59 | Admitting: Internal Medicine

## 2019-06-13 ENCOUNTER — Encounter: Payer: Self-pay | Admitting: Internal Medicine

## 2019-06-13 DIAGNOSIS — G4700 Insomnia, unspecified: Secondary | ICD-10-CM | POA: Diagnosis not present

## 2019-06-13 DIAGNOSIS — F32A Depression, unspecified: Secondary | ICD-10-CM

## 2019-06-13 DIAGNOSIS — F329 Major depressive disorder, single episode, unspecified: Secondary | ICD-10-CM

## 2019-06-13 DIAGNOSIS — F411 Generalized anxiety disorder: Secondary | ICD-10-CM | POA: Diagnosis not present

## 2019-06-13 MED ORDER — SERTRALINE HCL 25 MG PO TABS: ORAL_TABLET | ORAL | 1 refills | Status: DC

## 2019-06-13 MED ORDER — LORAZEPAM 0.5 MG PO TABS: 0.5000 mg | ORAL_TABLET | Freq: Four times a day (QID) | ORAL | 1 refills | Status: DC | PRN

## 2019-06-13 MED ORDER — TRAZODONE HCL 50 MG PO TABS: 25.0000 mg | ORAL_TABLET | Freq: Every evening | ORAL | 3 refills | Status: DC | PRN

## 2019-06-13 NOTE — Progress Notes (Signed)
Virtual Visit via Telephone Note  I connected with Jean Palmer, on 06/13/2019 at 10:53 AM by telephone due to the COVID-19 pandemic and verified that I am speaking with the correct person using two identifiers.   Consent: I discussed the limitations, risks, security and privacy concerns of performing an evaluation and management service by telephone and the availability of in person appointments. I also discussed with the patient that there may be a patient responsible charge related to this service. The patient expressed understanding and agreed to proceed.   Location of Patient: Home   Location of Provider: Clinic    Persons participating in Telemedicine visit: [facility] Dr. Juleen China      History of Present Illness: Patient has a visit for anxiety. She saw [facility] 6/1 and wanted to start medication for anxiety control. Feels like she could have a panic attack at any moment. I prescribed her Hydroxyzine between 6/1 and this appointment due to panic attacks. Says this is not taking the edge off anything. Has even tried to take 20 mg of it without relief.   In mid April she could feel herself feeling different. Anxiety became so strong that it feels debilitating. Things like going to the grocery store, talking to the doctor, etc makes her feel shaky and anxious.   Has had bouts of anxiety in the past and has taken medications in the past. Has also had a history of postpartum depression. Thinks she has taken Klonopin, Lorazepam in the past. Has been on antidepressant. At the time, she did not have health insurance and wasn't able to stay on the medication for very long due to cost.   Has not been able to sleep, pray, talk etc her way out of it. Is a recovering alcoholic and says that she knows she doesn't want to drink her way through things like she did in the past. Has been sober for 12 years.   GAD 7 : Generalized Anxiety Score 06/13/2019 06/04/2019  Nervous,  Anxious, on Edge 3 3  Control/stop worrying 3 3  Worry too much - different things 3 3  Trouble relaxing 3 3  Restless 1 3  Easily annoyed or irritable 2 3  Afraid - awful might happen 3 3  Total GAD 7 Score 18 21   Depression screen [facility] 2/9 06/04/2019 03/06/2019  Decreased Interest 3 0  Down, Depressed, Hopeless 3 0  PHQ - 2 Score 6 0  Altered sleeping 3 -  Tired, decreased energy 3 -  Change in appetite 3 -  Feeling bad or failure about yourself  3 -  Trouble concentrating 3 -  Moving slowly or fidgety/restless 2 -  Suicidal thoughts 0 -  PHQ-9 Score 23 -      Past Medical History:  Diagnosis Date  . Alcohol abuse   . Scoliosis    Allergies  Allergen Reactions  . Azithromycin Swelling    Current Outpatient Medications on File Prior to Visit  Medication Sig Dispense Refill  . hydrOXYzine (ATARAX/VISTARIL) 10 MG tablet Take 1 tablet (10 mg total) by mouth 3 (three) times daily as needed for anxiety. 30 tablet 0  . Misc Natural Products (OSTEO BI-FLEX JOINT SHIELD PO) Take by mouth.    . Multiple Vitamins-Minerals (MULTIVITAMIN ADULTS) TABS Take by mouth.     No current facility-administered medications on file prior to visit.    Observations/Objective: NAD. Speaking clearly.  Work of breathing normal.  Alert and oriented. Mood appropriate.  Assessment and Plan:  1. Generalized anxiety disorder GAD-7 significantly elevated, concerning for severe anxiety. Will start daily medication therapy with Zoloft. Will also hopefully have dual benefit of treating depressive symptoms. Given severity of anxiety and frequency of panic attacks, will prescribe Ativan. Discussed that this is a short term solution and that we should avoid long term use of benzodiazepines given risk profile. Hope is that as Zoloft improves her anxiety symptoms, we will be able to lessen/discontinue use of Ativan. Follow up with [place] within the next week and me within 4 weeks.  - sertraline (ZOLOFT) 25  MG tablet; Take one tablet daily. If tolerating, increase to two tablets in one week.  Dispense: 60 tablet; Refill: 1 - LORazepam (ATIVAN) 0.5 MG tablet; Take 1 tablet (0.5 mg total) by mouth every 6 (six) hours as needed for anxiety.  Dispense: 30 tablet; Refill: 1  2. Depression, unspecified depression type Patient feels like depression has stemmed from uncontrolled anxiety. PHQ-9 score was elevated on 6/1 concerning for severe depression. Start Zoloft as above. Discussed typical side effect profile and that medication can take up to 6 weeks to show effects. No safety concerns at present.  - sertraline (ZOLOFT) 25 MG tablet; Take one tablet daily. If tolerating, increase to two tablets in one week.  Dispense: 60 tablet; Refill: 1  3. Insomnia, unspecified type Likely worsened by anxiety/depression. Will treat with Trazodone as better sleep will also help to improve mood.  - traZODone (DESYREL) 50 MG tablet; Take 0.5-1 tablets (25-50 mg total) by mouth at bedtime as needed for sleep.  Dispense: 30 tablet; Refill: 3   Follow Up Instructions: Appointment with Delana Meyer, LCSW and f/u with PCP in 4 weeks    I discussed the assessment and treatment plan with the patient. The patient was provided an opportunity to ask questions and all were answered. The patient agreed with the plan and demonstrated an understanding of the instructions.   The patient was advised to call back or seek an in-person evaluation if the symptoms worsen or if the condition fails to improve as anticipated.     I provided 30 minutes total of non-face-to-face time during this encounter including median intraservice time, reviewing previous notes, investigations, ordering medications, medical decision making, coordinating care and patient verbalized understanding at the end of the visit.    Phill Myron, D.O. Primary Care at [facility]  06/13/2019, 10:53 AM

## 2019-06-20 ENCOUNTER — Other Ambulatory Visit: Payer: Self-pay

## 2019-06-20 ENCOUNTER — Ambulatory Visit: Payer: 59 | Attending: Internal Medicine | Admitting: Licensed Clinical Social Worker

## 2019-06-20 DIAGNOSIS — F411 Generalized anxiety disorder: Secondary | ICD-10-CM

## 2019-06-20 NOTE — BH Specialist Note (Signed)
Integrated Behavioral Health Visit via Telemedicine (Telephone)  06/20/2019 Jean Palmer [id number]   Session Start time: 11:00 AM  Session End time: 11:20 AM Total time: 20  Referring Provider: Dr. Juleen China Type of Visit: Telephonic Patient location: Home [facility] Provider location: Office All persons participating in visit: LCSW and Patient  Confirmed patient's address: Yes  Confirmed patient's phone number: Yes  Any changes to demographics: No   Confirmed patient's insurance: Yes  Any changes to patient's insurance: No   Discussed confidentiality: Yes    The following statements were read to the patient and/or legal guardian that are established with the [facility] Provider.  "The purpose of this phone visit is to provide behavioral health care while limiting exposure to the coronavirus (COVID19).  There is a possibility of technology failure and discussed alternative modes of communication if that failure occurs."  "By engaging in this telephone visit, you consent to the provision of healthcare.  Additionally, you authorize for your insurance to be billed for the services provided during this telephone visit."   Patient and/or legal guardian consented to telephone visit: Yes   PRESENTING CONCERNS: Patient and/or family reports the following symptoms/concerns: Pt reports decrease in symptoms since last visit with LCSW. Pt reports compliance with medications for the last week. Reports better quality of sleep and better management of anxiety. Endorses continued lack of motivation and depression symptoms Pt is running errands despite anxiety Duration of problem: Ongoing ; Severity of problem: moderate  STRENGTHS (Protective Factors/Coping Skills): Receives support from fiance Participating in medication management  GOALS ADDRESSED: Patient will: 1.  Reduce symptoms of: anxiety and depression  2.  Increase knowledge and/or ability of: self-management skills  3.   Demonstrate ability to: Increase healthy adjustment to current life circumstances, Increase adequate support systems for patient/family and Increase motivation to adhere to plan of care  INTERVENTIONS: Interventions utilized:  Mindfulness or Relaxation Training and Supportive Counseling Standardized Assessments completed: Not Needed  ASSESSMENT: Patient currently managing symptoms of anxiety and depression.   Patient may benefit from continued medication management and utilization of healthy coping skills. Pt reminded of upcoming appointment with PCP and agreed to contact LCSW should her symptoms increase. Grounding interventions discussed to assist with management of symptoms  PLAN: 1. Follow up with behavioral health clinician on : Schedule follow up appointment with LCSW  2. Behavioral recommendations: Utilize strategies discussed and continue with medication compliance 3. Referral(s): [place] (In Clinic)  Rebekah Chesterfield, [place] 07/03/2019 4:44 PM

## 2019-07-10 ENCOUNTER — Telehealth: Payer: Self-pay

## 2019-07-10 NOTE — Telephone Encounter (Signed)

## 2019-07-11 ENCOUNTER — Encounter: Payer: Self-pay | Admitting: Internal Medicine

## 2019-07-11 ENCOUNTER — Ambulatory Visit (INDEPENDENT_AMBULATORY_CARE_PROVIDER_SITE_OTHER): Payer: 59 | Admitting: Internal Medicine

## 2019-07-11 ENCOUNTER — Other Ambulatory Visit: Payer: Self-pay

## 2019-07-11 VITALS — BP 130/71 | HR 90 | Temp 97.9°F | Resp 17 | Wt 138.0 lb

## 2019-07-11 DIAGNOSIS — F329 Major depressive disorder, single episode, unspecified: Secondary | ICD-10-CM | POA: Diagnosis not present

## 2019-07-11 DIAGNOSIS — F411 Generalized anxiety disorder: Secondary | ICD-10-CM | POA: Diagnosis not present

## 2019-07-11 DIAGNOSIS — F32A Depression, unspecified: Secondary | ICD-10-CM

## 2019-07-11 MED ORDER — SERTRALINE HCL 100 MG PO TABS: 100.0000 mg | ORAL_TABLET | Freq: Every day | ORAL | 1 refills | Status: DC

## 2019-07-11 MED ORDER — LORAZEPAM 1 MG PO TABS: 1.0000 mg | ORAL_TABLET | Freq: Two times a day (BID) | ORAL | 1 refills | Status: DC | PRN

## 2019-07-11 NOTE — Progress Notes (Signed)
Subjective:    Jean Palmer - 54 y.o. female MRN [id number]  Date of birth: [birth year]  HPI  Jean Palmer is here for anxiety follow up. At visit on 6/10, we started Zoloft for anxiety/depression symptoms. Also given Rx for Ativan due to severe and frequent panic attacks. Patient has visit with Christa See, LCSW on 6/17. She is currently taking Zoloft 50 mg.   She is still feeling very bad. Reports anxiety is constant, she wakes up with it in the AM. Feels tightness in her chest and a pit in her stomach. Depressive symptoms have remained the same. Zero energy or motivation. Daily tasks such as going to grocery store or coming to this appointment feel overwhelming.   Thinks some of this stems from delayed reaction over the death of her father last year. 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 Her 20 year old son also causes her to worry a lot. He gets in trouble and makes bad decisions. They have a splintered relationship. She worries about losing her significant other and something bad happening to him.   Has good support at home from her significant other and can talk to him about what she is feeling.   GAD 7 : Generalized Anxiety Score 07/11/2019 06/13/2019 06/04/2019  Nervous, Anxious, on Edge 3 3 3   Control/stop worrying 3 3 3   Worry too much - different things 3 3 3   Trouble relaxing 3 3 3   Restless 1 1 3   Easily annoyed or irritable 1 2 3   Afraid - awful might happen 3 3 3   Total GAD 7 Score 17 18 21   Anxiety Difficulty Very difficult - -   Depression screen [facility] 2/9 07/11/2019 06/04/2019 03/06/2019  Decreased Interest 3 3 0  Down, Depressed, Hopeless 3 3 0  PHQ - 2 Score 6 6 0  Altered sleeping 2 3 -  Tired, decreased energy 3 3 -  Change in appetite 3 3 -  Feeling bad or failure about yourself  3 3 -  Trouble concentrating 3 3 -  Moving slowly or fidgety/restless 1 2 -  Suicidal thoughts - 0 -  PHQ-9 Score 21 23 -  Difficult  doing work/chores Very difficult - -     Health Maintenance:  Health Maintenance Due  Topic Date Due  . Hepatitis C Screening  Never done  . COVID-19 Vaccine (1) Never done    -  reports that she has been smoking cigarettes. She has been smoking about 0.50 packs per day. She has never used smokeless tobacco. - Review of Systems: Per HPI. - Past Medical History: Patient Active Problem List   Diagnosis Date Noted  . Pap smear of cervix shows high risk HPV present 03/13/2019  . Hyperlipidemia 03/06/2019  . Tobacco use disorder 01/14/2019   - Medications: reviewed and updated   Objective:   Physical Exam BP 130/71   Pulse 90   Temp 97.9 F (36.6 C) (Temporal)   Resp 17   Wt 138 lb (62.6 kg)   SpO2 97%   BMI 25.24 kg/m  Physical Exam Constitutional:      General: She is not in acute distress.    Appearance: She is not diaphoretic.  Cardiovascular:     Rate and Rhythm: Normal rate.  Pulmonary:     Effort: Pulmonary effort is normal. No respiratory distress.  Musculoskeletal:        General: Normal range  of motion.  Skin:    General: Skin is warm and dry.  Neurological:     Mental Status: She is alert and oriented to person, place, and time.  Psychiatric:        Mood and Affect: Affect normal.        Judgment: Judgment normal.     Comments: Anxious. Tearful.             Assessment & Plan:   1. Generalized anxiety disorder GAD7 score remains elevated. Increase Zoloft to 100 mg. Will give higher dose of Ativan as well. Had long discussion that this medication does not actually treat anxiety but just helps to manage symptoms as we find the correct treatment regimen. Discussed its not a long term medication and that it can be addictive. Will schedule f/u with Christa See and also place referral for counseling. Return in 6 weeks.  - sertraline (ZOLOFT) 100 MG tablet; Take 1 tablet (100 mg total) by mouth daily.  Dispense: 30 tablet; Refill: 1 - Ambulatory referral  to Psychology - LORazepam (ATIVAN) 1 MG tablet; Take 1 tablet (1 mg total) by mouth 2 (two) times daily as needed for anxiety.  Dispense: 30 tablet; Refill: 1  2. Depression, unspecified depression type PHQ-9 remains elevated. No SI. Increase Zoloft and other plan as above.  - Ambulatory referral to Psychology    Phill Myron, D.O. 07/11/2019, 10:58 AM Primary Care at [facility]

## 2019-07-16 ENCOUNTER — Telehealth (INDEPENDENT_AMBULATORY_CARE_PROVIDER_SITE_OTHER): Payer: 59 | Admitting: Licensed Clinical Social Worker

## 2019-07-16 ENCOUNTER — Other Ambulatory Visit: Payer: Self-pay

## 2019-07-16 DIAGNOSIS — F331 Major depressive disorder, recurrent, moderate: Secondary | ICD-10-CM

## 2019-07-16 DIAGNOSIS — F411 Generalized anxiety disorder: Secondary | ICD-10-CM

## 2019-07-22 NOTE — BH Specialist Note (Signed)
Integrated Behavioral Health Visit via Telemedicine (Telephone)  07/16/2019 Jean Palmer [id number]   Session Start time: 3:50 PM  Session End time: 4:10 PM Total time: 20  Referring Provider: Dr. Juleen China Type of Visit: Telephonic Patient location: Home [facility] Provider location: Office All persons participating in visit: LCSW and Patient  Confirmed patient's address: Yes  Confirmed patient's phone number: Yes  Any changes to demographics: No   Confirmed patient's insurance: Yes  Any changes to patient's insurance: No   Discussed confidentiality: Yes    The following statements were read to the patient and/or legal guardian that are established with the [facility] Provider.  "The purpose of this phone visit is to provide behavioral health care while limiting exposure to the coronavirus (COVID19).  There is a possibility of technology failure and discussed alternative modes of communication if that failure occurs."  "By engaging in this telephone visit, you consent to the provision of healthcare.  Additionally, you authorize for your insurance to be billed for the services provided during this telephone visit."   Patient and/or legal guardian consented to telephone visit: Yes   PRESENTING CONCERNS: Patient and/or family reports the following symptoms/concerns: Jean Palmer reports compliance with medication management. Reports difficulty managing symptoms triggered by strained relationship with adult son.  Duration of problem: Ongoing; Severity of problem: moderate  STRENGTHS (Protective Factors/Coping Skills): Jean Palmer has desire to change Jean Palmer receives strong support Jean Palmer is participating in medication management through PCP  GOALS ADDRESSED: Patient will: 1.  Reduce symptoms of: anxiety, depression and stress  2.  Increase knowledge and/or ability of: stress reduction  3.  Demonstrate ability to: Increase healthy adjustment to current life circumstances, Increase adequate support  systems for patient/family and Establish healthy boundaries  INTERVENTIONS: Interventions utilized:  Behavioral Activation and Supportive Counseling Standardized Assessments completed: Not Needed  ASSESSMENT: Patient currently experiencing difficulty managing symptoms triggered by strained relationship with adult son. Reports minimal decrease in symptoms noting that meds were recently adjusted.   Patient may benefit from continued medication management and therapy. Validation and encouragement provided. Jean Palmer utilizes serenity prayer and is still in contact with previous group members to strengthen support system. Jean Palmer in agreeance with establishing a routine to promote feelings of productivity.  PLAN: 1. Follow up with behavioral health clinician on : 07/30/2019 2. Behavioral recommendations: Utilize healthy coping skills discussed and continue with medication management 3. Referral(s): [place] (In Clinic)  Rebekah Chesterfield, [place] 07/22/2019 5:28 PM

## 2019-07-30 ENCOUNTER — Other Ambulatory Visit: Payer: Self-pay

## 2019-07-30 ENCOUNTER — Ambulatory Visit (INDEPENDENT_AMBULATORY_CARE_PROVIDER_SITE_OTHER): Payer: 59 | Admitting: Licensed Clinical Social Worker

## 2019-07-30 DIAGNOSIS — F411 Generalized anxiety disorder: Secondary | ICD-10-CM

## 2019-08-07 NOTE — BH Specialist Note (Signed)
Integrated Behavioral Health Visit via Telemedicine (Telephone)  07/30/2019 Jean Palmer [id number]   Session Start time: 1:30 PM  Session End time: 1:50 PM Total time: 20  Referring Provider: Dr. Juleen China Type of Visit: Telephonic Patient location: Home [facility] Provider location: Office All persons participating in visit: LCSW and patient  Confirmed patient's address: Yes  Confirmed patient's phone number: Yes  Any changes to demographics: No   Confirmed patient's insurance: Yes  Any changes to patient's insurance: No   Discussed confidentiality: Yes    The following statements were read to the patient and/or legal guardian that are established with the [facility] Provider.  "The purpose of this phone visit is to provide behavioral health care while limiting exposure to the coronavirus (COVID19).  There is a possibility of technology failure and discussed alternative modes of communication if that failure occurs."  "By engaging in this telephone visit, you consent to the provision of healthcare.  Additionally, you authorize for your insurance to be billed for the services provided during this telephone visit."   Patient and/or legal guardian consented to telephone visit: Yes   PRESENTING CONCERNS: Patient and/or family reports the following symptoms/concerns: Patient shared that anxiety symptoms have decreased since recent medication changes by PCP.  Patient reports having more energy and productivity by spending time with partner and running errands Duration of problem: Ongoing; Severity of problem: moderate  STRENGTHS (Protective Factors/Coping Skills): Patient has strong support system And has good insight Patient is participating in medication management through PCP  GOALS ADDRESSED: Patient will: 1.  Reduce symptoms of: anxiety  2.  Increase knowledge and/or ability of: self-management skills  3.  Demonstrate ability to: Increase healthy adjustment to current  life circumstances and Increase motivation to adhere to plan of care  INTERVENTIONS: Interventions utilized:  Brief CBT Standardized Assessments completed: Not Needed  ASSESSMENT: Patient currently experiencing decrease in anxiety symptoms.  She has noticed an increase in energy and motivation to run errands and spend time with family.   Patient may benefit from ongoing medication management and brief therapy.  Cognitive distortions, such as, all or nothing thinking were discussed and patient identified strategies to maintain motivation.  PLAN: 1. Follow up with behavioral health clinician on : August 13, 2019 2. Behavioral recommendations: Continue to utilize strategies discussed and comply with medication management 3. Referral(s): [place] (In Clinic)  Rebekah Chesterfield, [place] 08/07/2019 6:58 AM

## 2019-08-13 ENCOUNTER — Other Ambulatory Visit: Payer: Self-pay

## 2019-08-13 ENCOUNTER — Ambulatory Visit: Payer: 59 | Admitting: Licensed Clinical Social Worker

## 2019-08-22 ENCOUNTER — Telehealth (INDEPENDENT_AMBULATORY_CARE_PROVIDER_SITE_OTHER): Payer: 59 | Admitting: Internal Medicine

## 2019-08-22 DIAGNOSIS — G4700 Insomnia, unspecified: Secondary | ICD-10-CM

## 2019-08-22 DIAGNOSIS — F411 Generalized anxiety disorder: Secondary | ICD-10-CM | POA: Diagnosis not present

## 2019-08-22 DIAGNOSIS — F331 Major depressive disorder, recurrent, moderate: Secondary | ICD-10-CM | POA: Diagnosis not present

## 2019-08-22 MED ORDER — SERTRALINE HCL 100 MG PO TABS: 100.0000 mg | ORAL_TABLET | Freq: Every day | ORAL | 1 refills | Status: DC

## 2019-08-22 MED ORDER — LORAZEPAM 1 MG PO TABS: 1.0000 mg | ORAL_TABLET | Freq: Two times a day (BID) | ORAL | 1 refills | Status: DC | PRN

## 2019-08-22 NOTE — Progress Notes (Signed)
Virtual Visit via Telephone Note  I connected with Jean Palmer, on 08/22/2019 at 10:50 AM by telephone due to the COVID-19 pandemic and verified that I am speaking with the correct person using two identifiers.   Consent: I discussed the limitations, risks, security and privacy concerns of performing an evaluation and management service by telephone and the availability of in person appointments. I also discussed with the patient that there may be a patient responsible charge related to this service. The patient expressed understanding and agreed to proceed.   Location of Patient: Home   Location of Provider: Clinic   Persons participating in Telemedicine visit: [place] Linhart [facility] Dr. Juleen China      History of Present Illness: Patient has a visit to f/u anxiety and depression. At last visit on 7/8, increased Zoloft to 100 mg. Also gave short term refill for Ativan 1 mg BID prn anxiety for panic attacks. She is followed by Christa See, LCSW. Has another phone visit with her next week.   She thinks the Zoloft took a while to kick in but thinks it has now. She is not feeling as hopeless or having no reason to live/no purpose. The negative thoughts have decreased to a a manageable point. No suicidal thoughts.   Most of the time the anxiety is in check. Able to use breathing techniques etc to help. However, something will precipitate anxiety occasionally and she will have racing thoughts. Estimates she is having this occur every other to every third day. Will take Ativan 1/2 tablet.   She still struggles with racing thoughts at nighttime. She has had this issue for a long time but used to use alcohol as coping mechanism. With the Trazodone she is able to sleep well and feels like that helps her with her mood the next day.  Feels a difference in her outlook. No longer feeling like a "big mess" and feels more herself to some degree.    Depression screen [facility] 2/9 08/22/2019  07/11/2019 06/04/2019  Decreased Interest 2 3 3   Down, Depressed, Hopeless 2 3 3   PHQ - 2 Score 4 6 6   Altered sleeping 1 2 3   Tired, decreased energy 1 3 3   Change in appetite 2 3 3   Feeling bad or failure about yourself  1 3 3   Trouble concentrating 1 3 3   Moving slowly or fidgety/restless 1 1 2   Suicidal thoughts - - 0  PHQ-9 Score 11 21 23   Difficult doing work/chores - Very difficult -   GAD 7 : Generalized Anxiety Score 08/22/2019 07/11/2019 06/13/2019 06/04/2019  Nervous, Anxious, on Edge 2 3 3 3   Control/stop worrying 1 3 3 3   Worry too much - different things 1 3 3 3   Trouble relaxing 1 3 3 3   Restless 1 1 1 3   Easily annoyed or irritable 1 1 2 3   Afraid - awful might happen 2 3 3 3   Total GAD 7 Score 9 17 18 21   Anxiety Difficulty - Very difficult - -        Past Medical History:  Diagnosis Date  . Alcohol abuse   . Scoliosis    Allergies  Allergen Reactions  . Azithromycin Swelling    Current Outpatient Medications on File Prior to Visit  Medication Sig Dispense Refill  . Misc Natural Products (OSTEO BI-FLEX JOINT SHIELD PO) Take by mouth.    . Multiple Vitamins-Minerals (MULTIVITAMIN ADULTS) TABS Take by mouth.    . traZODone (DESYREL) 50  MG tablet Take 0.5-1 tablets (25-50 mg total) by mouth at bedtime as needed for sleep. 30 tablet 3   No current facility-administered medications on file prior to visit.    Observations/Objective: NAD. Speaking clearly.  Work of breathing normal.  Alert and oriented. Mood appropriate.   Assessment and Plan: 1. Moderate episode of recurrent major depressive disorder ([place]) 2. GAD (generalized anxiety disorder) PHQ-9 and GAD-7 are improved. Continue current pharmacotherapy and f/u with Christa See, LCSW. No safety concerns at present. We discussed that patient would feel comfortable with 2 month f/u to re-assess mood and medication regimen.  - sertraline (ZOLOFT) 100 MG tablet; Take 1 tablet (100 mg total) by mouth daily.   Dispense: 90 tablet; Refill: 1 - LORazepam (ATIVAN) 1 MG tablet; Take 1 tablet (1 mg total) by mouth 2 (two) times daily as needed for anxiety.  Dispense: 30 tablet; Refill: 1  3. Insomnia, unspecified type Improved with Trazodone. Continue prn. Discussed sleep hygiene tactics.   Follow Up Instructions: 2 month f/u    I discussed the assessment and treatment plan with the patient. The patient was provided an opportunity to ask questions and all were answered. The patient agreed with the plan and demonstrated an understanding of the instructions.   The patient was advised to call back or seek an in-person evaluation if the symptoms worsen or if the condition fails to improve as anticipated.     I provided 22 minutes total of non-face-to-face time during this encounter including median intraservice time, reviewing previous notes, investigations, ordering medications, medical decision making, coordinating care and patient verbalized understanding at the end of the visit.    Phill Myron, D.O. Primary Care at [facility]  08/22/2019, 10:50 AM

## 2019-08-27 ENCOUNTER — Ambulatory Visit: Payer: 59 | Admitting: Licensed Clinical Social Worker

## 2019-09-03 ENCOUNTER — Other Ambulatory Visit: Payer: Self-pay

## 2019-09-03 ENCOUNTER — Ambulatory Visit (INDEPENDENT_AMBULATORY_CARE_PROVIDER_SITE_OTHER): Payer: 59 | Admitting: Licensed Clinical Social Worker

## 2019-09-03 DIAGNOSIS — F331 Major depressive disorder, recurrent, moderate: Secondary | ICD-10-CM

## 2019-09-03 DIAGNOSIS — F411 Generalized anxiety disorder: Secondary | ICD-10-CM

## 2019-09-03 NOTE — BH Specialist Note (Signed)
Follow up call placed to patient. Pt shared that she is doing well. Pt's anxiety and depression has decreased as evidenced by recent PHQ-9 and GAD-7 scores.   Pt attributes decrease in symptoms to medication adjustments. Feels she is in a "manageable place, I'm having more good days than bad" Reports improvement in sleep. Pt is utilizing healthy coping skills (feeding birds, cooking, and talking with partner about stressors) She denies SI/HI.   LCSW commended patient on utilizing support system, healthy coping skills, and medication compliance. Pt was strongly encouraged to contact LCSW with any additional behavioral health and/or resource needs.

## 2019-10-01 ENCOUNTER — Ambulatory Visit: Payer: 59 | Admitting: Licensed Clinical Social Worker

## 2019-10-02 ENCOUNTER — Other Ambulatory Visit: Payer: Self-pay

## 2019-10-02 DIAGNOSIS — G4700 Insomnia, unspecified: Secondary | ICD-10-CM

## 2019-10-02 MED ORDER — TRAZODONE HCL 50 MG PO TABS: 25.0000 mg | ORAL_TABLET | Freq: Every evening | ORAL | 3 refills | Status: DC | PRN

## 2019-10-24 ENCOUNTER — Telehealth: Payer: 59 | Admitting: Internal Medicine

## 2019-10-24 DIAGNOSIS — F411 Generalized anxiety disorder: Secondary | ICD-10-CM

## 2019-10-24 DIAGNOSIS — F331 Major depressive disorder, recurrent, moderate: Secondary | ICD-10-CM

## 2019-10-30 ENCOUNTER — Telehealth (INDEPENDENT_AMBULATORY_CARE_PROVIDER_SITE_OTHER): Payer: 59 | Admitting: Physician Assistant

## 2019-10-30 DIAGNOSIS — F331 Major depressive disorder, recurrent, moderate: Secondary | ICD-10-CM

## 2019-10-30 DIAGNOSIS — F411 Generalized anxiety disorder: Secondary | ICD-10-CM | POA: Diagnosis not present

## 2019-10-30 DIAGNOSIS — F1011 Alcohol abuse, in remission: Secondary | ICD-10-CM

## 2019-10-30 MED ORDER — LORAZEPAM 1 MG PO TABS: 0.5000 mg | ORAL_TABLET | Freq: Two times a day (BID) | ORAL | 0 refills | Status: DC | PRN

## 2019-10-30 NOTE — Progress Notes (Signed)
Patient ID: Jean Palmer, female   DOB: 1965-07-19, 54 y.o.   MRN: [id number] Virtual Visit via Telephone Note  I connected with Jean Palmer on 10/30/19 at  2:10 PM EDT by telephone and verified that I am speaking with the correct person using two identifiers.  Location: Patient: Jean Palmer Provider: Freeman Caldron, PA-C   I discussed the limitations, risks, security and privacy concerns of performing an evaluation and management service by telephone and the availability of in person appointments. I also discussed with the patient that there may be a patient responsible charge related to this service. The patient expressed understanding and agreed to proceed.  PATIENT visit by telephone virtually in the context of Covid-19 pandemic. Patient location:  home My Location:  [place] office Persons on the call:  Me, the patient, and roomed by Allyne Gee     History of Present Illness:  Patient says anxiety and depression are much improved.  Sleep is much better.  Anxiety and depression are improved.  Sober from alcohol since 2009 so using ativan very sparingly.  Last filled 10/5 and has plenty left. zoloft and trazadone helping too.  No longer goes to 12 step meetings in years.      Observations/Objective:  NAD.  A&Ox3.     Depression screen [facility] 2/9 10/30/2019 08/22/2019 07/11/2019  Decreased Interest 1 2 3   Down, Depressed, Hopeless 2 2 3   PHQ - 2 Score 3 4 6   Altered sleeping 2 1 2   Tired, decreased energy 2 1 3   Change in appetite 2 2 3   Feeling bad or failure about yourself  1 1 3   Trouble concentrating 1 1 3   Moving slowly or fidgety/restless 0 1 1  Suicidal thoughts 0 - -  PHQ-9 Score 11 11 21   Difficult doing work/chores - - Very difficult   GAD 7 : Generalized Anxiety Score 10/30/2019 08/22/2019 07/11/2019 06/13/2019  Nervous, Anxious, on Edge 2 2 3 3   Control/stop worrying 2 1 3 3   Worry too much - different things 2 1 3 3   Trouble relaxing 3 1 3 3   Restless 2 1 1 1    Easily annoyed or irritable 1 1 1 2   Afraid - awful might happen 1 2 3 3   Total GAD 7 Score 13 9 17 18   Anxiety Difficulty - - Very difficult -      Assessment and Plan: 1. Moderate episode of recurrent major depressive disorder (HCC) Continue zoloft. - LORazepam (ATIVAN) 1 MG tablet; Take 0.5 tablets (0.5 mg total) by mouth 2 (two) times daily as needed for anxiety. USE SPARINGLY  Dispense: 30 tablet; Refill: 0  2. Generalized anxiety disorder 12 step meetings would also help with dealing with anxiety and depression.  EXTREME caution with ativan.  Will send RF she can p/up on or after 11/08/2019 - LORazepam (ATIVAN) 1 MG tablet; Take 0.5 tablets (0.5 mg total) by mouth 2 (two) times daily as needed for anxiety. USE SPARINGLY  Dispense: 30 tablet; Refill: 0  3. H/O alcohol abuse Continue with trazadone for sleep. I have counseled the patient at length about substance abuse and addiction.  12 step meetings/recovery recommended.  Local 12 step meeting lists were given and attendance was encouraged.  Patient expresses understanding.      Follow Up Instructions: close f/up-see dr Juleen China in 6 weeks   I discussed the assessment and treatment plan with the patient. The patient was provided an opportunity to ask questions and all were answered. The  patient agreed with the plan and demonstrated an understanding of the instructions.   The patient was advised to call back or seek an in-person evaluation if the symptoms worsen or if the condition fails to improve as anticipated.  I provided 21 minutes of non-face-to-face time during this encounter.   Freeman Caldron, PA-C

## 2019-10-30 NOTE — Addendum Note (Signed)
Addended by: Argentina Donovan on: 10/30/2019 03:41 PM   Modules accepted: Level of Service

## 2020-01-28 ENCOUNTER — Other Ambulatory Visit: Payer: Self-pay | Admitting: Internal Medicine

## 2020-01-28 ENCOUNTER — Other Ambulatory Visit: Payer: Self-pay | Admitting: Physician Assistant

## 2020-01-28 DIAGNOSIS — F331 Major depressive disorder, recurrent, moderate: Secondary | ICD-10-CM

## 2020-01-28 DIAGNOSIS — G4700 Insomnia, unspecified: Secondary | ICD-10-CM

## 2020-01-28 DIAGNOSIS — F411 Generalized anxiety disorder: Secondary | ICD-10-CM

## 2020-01-28 NOTE — Telephone Encounter (Signed)
Requested medication (s) are due for refill today: yes  Requested medication (s) are on the active medication list: yes  Last refill:  10/30/19 #30 0 refill  Future visit scheduled: no  Notes to clinic:  not delegated per protocol      Requested Prescriptions  Pending Prescriptions Disp Refills   LORazepam (ATIVAN) 1 MG tablet [Pharmacy Med Name: LORazepam 1 MG Oral Tablet] 30 tablet 0    Sig: TAKE 1/2 (ONE-HALF) TABLET BY MOUTH TWICE DAILY AS NEEDED FOR ANXIETY      Not Delegated - Psychiatry:  Anxiolytics/Hypnotics Failed - 01/28/2020  1:26 PM      Failed - This refill cannot be delegated      Failed - Urine Drug Screen completed in last 360 days      Failed - Valid encounter within last 6 months    Recent Outpatient Visits   None

## 2020-01-28 NOTE — Telephone Encounter (Signed)
Will forward to pcp

## 2020-02-25 ENCOUNTER — Other Ambulatory Visit: Payer: Self-pay | Admitting: Internal Medicine

## 2020-02-25 DIAGNOSIS — F411 Generalized anxiety disorder: Secondary | ICD-10-CM

## 2020-03-15 ENCOUNTER — Other Ambulatory Visit: Payer: Self-pay | Admitting: Internal Medicine

## 2020-03-15 DIAGNOSIS — G4700 Insomnia, unspecified: Secondary | ICD-10-CM

## 2020-05-18 ENCOUNTER — Other Ambulatory Visit: Payer: Self-pay | Admitting: Internal Medicine

## 2020-05-18 DIAGNOSIS — G4700 Insomnia, unspecified: Secondary | ICD-10-CM

## 2020-05-28 ENCOUNTER — Other Ambulatory Visit: Payer: Self-pay | Admitting: Internal Medicine

## 2020-05-28 DIAGNOSIS — F411 Generalized anxiety disorder: Secondary | ICD-10-CM

## 2020-08-25 NOTE — Progress Notes (Signed)
Patient ID: Jean Palmer, female    DOB: 23-Dec-1965  MRN: [id number]  CC: Anxiety Depression Follow-Up  Subjective: Jean Palmer is a 55 y.o. female who presents for anxiety depression follow-up.   Her concerns today include:   DEPRESSION FOLLOW-UP: 10/30/2019 per PA note: Continued on Zoloft.   08/26/2020: Doing well on Zoloft. No issues and/or concerns.  2. ANXIETY FOLLOW-UP: 10/30/2019 per PA note: 12 step meetings would also help with dealing with anxiety and depression.  EXTREME caution with ativan.  Will send RF she can p/up on or after 11/08/2019  08/26/2020: Not using Ativan often only when anxiety very high. Declines referral to Psychiatry.  3. ALCOHOL ABUSE FOLLOW-UP: 10/30/2019 per PA note: Continue with trazadone for sleep. I have counseled the patient at length about substance abuse and addiction.  12 step meetings/recovery recommended.  Local 12 step meeting lists were given and attendance was encouraged.  Patient expresses understanding.   08/26/2020: Doing well on Trazodone. Usually taking half dose. Sober since 2009.   Patient Active Problem List   Diagnosis Date Noted   Pap smear of cervix shows high risk HPV present 03/13/2019   Hyperlipidemia 03/06/2019   Tobacco use disorder 01/14/2019     Current Outpatient Medications on File Prior to Visit  Medication Sig Dispense Refill   LORazepam (ATIVAN) 1 MG tablet TAKE 1/2 (ONE-HALF) TABLET BY MOUTH TWICE DAILY AS NEEDED FOR ANXIETY 30 tablet 0   Misc Natural Products (OSTEO BI-FLEX JOINT SHIELD PO) Take by mouth.     Multiple Vitamins-Minerals (MULTIVITAMIN ADULTS) TABS Take by mouth.     No current facility-administered medications on file prior to visit.    Allergies  Allergen Reactions   Azithromycin Swelling    Social History   Socioeconomic History   Marital status: Single    Spouse name: Not on file   Number of children: Not on file   Years of education: Not on file   Highest education  level: Not on file  Occupational History   Not on file  Tobacco Use   Smoking status: Every Day    Packs/day: 0.50    Types: Cigarettes   Smokeless tobacco: Never  Vaping Use   Vaping Use: Never used  Substance and Sexual Activity   Alcohol use: Not Currently   Drug use: Not Currently   Sexual activity: Not on file  Other Topics Concern   Not on file  Social History Narrative   Not on file   Social Determinants of Health   Financial Resource Strain: Not on file  Food Insecurity: Not on file  Transportation Needs: Not on file  Physical Activity: Not on file  Stress: Not on file  Social Connections: Not on file  Intimate Partner Violence: Not on file    Family History  Adopted: Yes    Past Surgical History:  Procedure Laterality Date   SPINAL FUSION     THERAPEUTIC ABORTION      ROS: Review of Systems Negative except as stated above  PHYSICAL EXAM: BP 110/77 (BP Location: Left Arm, Patient Position: Sitting, Cuff Size: Normal)   Pulse 91   Temp 99.3 F (37.4 C) (Oral)   Resp 16   Ht '5\' 3"'$  (1.6 m)   Wt 158 lb 12.8 oz (72 kg)   SpO2 95%   BMI 28.13 kg/m   Physical Exam HENT:     Head: Normocephalic and atraumatic.  Eyes:     Extraocular Movements: Extraocular movements intact.  Conjunctiva/sclera: Conjunctivae normal.     Pupils: Pupils are equal, round, and reactive to light.  Cardiovascular:     Rate and Rhythm: Normal rate and regular rhythm.     Pulses: Normal pulses.     Heart sounds: Normal heart sounds.  Pulmonary:     Effort: Pulmonary effort is normal.     Breath sounds: Normal breath sounds.  Musculoskeletal:     Cervical back: Normal range of motion and neck supple.  Neurological:     General: No focal deficit present.     Mental Status: She is alert and oriented to person, place, and time.  Psychiatric:        Mood and Affect: Mood normal.        Behavior: Behavior normal.    ASSESSMENT AND PLAN: 1. Moderate episode of  recurrent major depressive disorder ([place]): 2. Generalized anxiety disorder: - Patient denies thoughts of self-harm, suicidal ideations, and homicidal ideations. - Continue Sertraline as prescribed.  - Counseled patient I do not prescribe Lorazepam. Offered referral to Psychiatry for further evaluation and management, patient declined.  - Follow-up with primary provider in 3 months or sooner if needed.  - sertraline (ZOLOFT) 100 MG tablet; TAKE 1 TABLET BY MOUTH ONCE DAILY . APPOINTMENT REQUIRED FOR FUTURE REFILLS  Dispense: 90 tablet; Refill: 0  3. Insomnia, unspecified type: - Continue Trazodone as prescribed.  - Follow-up with primary provider as scheduled. - traZODone (DESYREL) 50 MG tablet; TAKE 1/2-1 TABLET BY MOUTH AT BEDTIME AS NEEDED FOR SLEEP  Dispense: 30 tablet; Refill: 1  4. H/O alcohol abuse: - Patient reports sober since 2009.   Patient was given the opportunity to ask questions.  Patient verbalized understanding of the plan and was able to repeat key elements of the plan. Patient was given clear instructions to go to Emergency Department or return to medical center if symptoms don't improve, worsen, or new problems develop.The patient verbalized understanding.   Requested Prescriptions   Signed Prescriptions Disp Refills   sertraline (ZOLOFT) 100 MG tablet 90 tablet 0    Sig: TAKE 1 TABLET BY MOUTH ONCE DAILY . APPOINTMENT REQUIRED FOR FUTURE REFILLS   traZODone (DESYREL) 50 MG tablet 30 tablet 1    Sig: TAKE 1/2-1 TABLET BY MOUTH AT BEDTIME AS NEEDED FOR SLEEP    Follow-up with primary provider as scheduled.   Camillia Herter, NP

## 2020-08-26 ENCOUNTER — Other Ambulatory Visit: Payer: Self-pay

## 2020-08-26 ENCOUNTER — Ambulatory Visit (INDEPENDENT_AMBULATORY_CARE_PROVIDER_SITE_OTHER): Payer: 59 | Admitting: Family

## 2020-08-26 ENCOUNTER — Encounter: Payer: Self-pay | Admitting: Family

## 2020-08-26 VITALS — BP 110/77 | HR 91 | Temp 99.3°F | Resp 16 | Ht 63.0 in | Wt 158.8 lb

## 2020-08-26 DIAGNOSIS — F331 Major depressive disorder, recurrent, moderate: Secondary | ICD-10-CM

## 2020-08-26 DIAGNOSIS — F1011 Alcohol abuse, in remission: Secondary | ICD-10-CM | POA: Diagnosis not present

## 2020-08-26 DIAGNOSIS — F411 Generalized anxiety disorder: Secondary | ICD-10-CM

## 2020-08-26 DIAGNOSIS — G4700 Insomnia, unspecified: Secondary | ICD-10-CM

## 2020-08-26 MED ORDER — SERTRALINE HCL 100 MG PO TABS: ORAL_TABLET | ORAL | 0 refills | Status: DC

## 2020-08-26 MED ORDER — TRAZODONE HCL 50 MG PO TABS: ORAL_TABLET | ORAL | 1 refills | Status: DC

## 2020-08-26 NOTE — Progress Notes (Signed)
Patient has no new concerns today for provider.

## 2020-11-14 ENCOUNTER — Other Ambulatory Visit: Payer: Self-pay | Admitting: Family

## 2020-11-14 DIAGNOSIS — F331 Major depressive disorder, recurrent, moderate: Secondary | ICD-10-CM

## 2020-11-14 DIAGNOSIS — F411 Generalized anxiety disorder: Secondary | ICD-10-CM

## 2020-11-19 ENCOUNTER — Other Ambulatory Visit: Payer: Self-pay | Admitting: Family

## 2020-11-19 DIAGNOSIS — F411 Generalized anxiety disorder: Secondary | ICD-10-CM

## 2020-11-19 DIAGNOSIS — F331 Major depressive disorder, recurrent, moderate: Secondary | ICD-10-CM

## 2020-11-23 ENCOUNTER — Ambulatory Visit (INDEPENDENT_AMBULATORY_CARE_PROVIDER_SITE_OTHER): Payer: 59 | Admitting: Family Medicine

## 2020-11-23 ENCOUNTER — Other Ambulatory Visit: Payer: Self-pay

## 2020-11-23 ENCOUNTER — Encounter: Payer: Self-pay | Admitting: Family Medicine

## 2020-11-23 DIAGNOSIS — F411 Generalized anxiety disorder: Secondary | ICD-10-CM | POA: Diagnosis not present

## 2020-11-23 DIAGNOSIS — G4700 Insomnia, unspecified: Secondary | ICD-10-CM

## 2020-11-23 DIAGNOSIS — F331 Major depressive disorder, recurrent, moderate: Secondary | ICD-10-CM | POA: Diagnosis not present

## 2020-11-23 MED ORDER — SERTRALINE HCL 100 MG PO TABS: ORAL_TABLET | ORAL | 1 refills | Status: DC

## 2020-11-23 MED ORDER — TRAZODONE HCL 50 MG PO TABS: ORAL_TABLET | ORAL | 1 refills | Status: DC

## 2020-11-23 NOTE — Progress Notes (Signed)
Established Patient Office Visit  Subjective:  Patient ID: Jean Palmer, female    DOB: Jan 14, 1965  Age: 55 y.o. MRN: [id number]  CC:  Chief Complaint  Patient presents with   Medication Refill    HPI Jean Palmer presents for follow up of chronic med issues with med refills. Patient denies acute complaints or concerns.   Past Medical History:  Diagnosis Date   Alcohol abuse    Scoliosis      Social History   Socioeconomic History   Marital status: Single    Spouse name: Not on file   Number of children: Not on file   Years of education: Not on file   Highest education level: Not on file  Occupational History   Not on file  Tobacco Use   Smoking status: Every Day    Packs/day: 0.50    Types: Cigarettes   Smokeless tobacco: Never  Vaping Use   Vaping Use: Never used  Substance and Sexual Activity   Alcohol use: Not Currently   Drug use: Not Currently   Sexual activity: Not on file  Other Topics Concern   Not on file  Social History Narrative   Not on file   Social Determinants of Health   Financial Resource Strain: Not on file  Food Insecurity: Not on file  Transportation Needs: Not on file  Physical Activity: Not on file  Stress: Not on file  Social Connections: Not on file  Intimate Partner Violence: Not on file    ROS Review of Systems  Psychiatric/Behavioral:  Negative for self-injury, sleep disturbance and suicidal ideas. The patient is not nervous/anxious.   All other systems reviewed and are negative.  Objective:   Today's Vitals: BP 118/69   Pulse 88   Temp 98.1 F (36.7 C) (Oral)   Resp 16   Wt 164 lb 9.6 oz (74.7 kg)   SpO2 96%   BMI 29.16 kg/m   Physical Exam Vitals and nursing note reviewed.  Constitutional:      General: She is not in acute distress. Cardiovascular:     Rate and Rhythm: Normal rate and regular rhythm.  Pulmonary:     Effort: Pulmonary effort is normal.     Breath sounds: Normal breath sounds.   Neurological:     General: No focal deficit present.     Mental Status: She is alert and oriented to person, place, and time.  Psychiatric:        Mood and Affect: Mood normal.        Behavior: Behavior normal.    Assessment & Plan:   1. Moderate episode of recurrent major depressive disorder (HCC) Appears stable. Continue present management. Meds refilled  - sertraline (ZOLOFT) 100 MG tablet; TAKE 1 TABLET BY MOUTH ONCE DAILY . APPOINTMENT REQUIRED FOR FUTURE REFILLS  Dispense: 90 tablet; Refill: 1  2. Generalized anxiety disorder Appears stable. Continue present management. Meds refilled  - sertraline (ZOLOFT) 100 MG tablet; TAKE 1 TABLET BY MOUTH ONCE DAILY . APPOINTMENT REQUIRED FOR FUTURE REFILLS  Dispense: 90 tablet; Refill: 1  3. Insomnia, unspecified type Doing well with present management. Meds refilled  - traZODone (DESYREL) 50 MG tablet; TAKE 1/2-1 TABLET BY MOUTH AT BEDTIME AS NEEDED FOR SLEEP  Dispense: 90 tablet; Refill: 1    Outpatient Encounter Medications as of 11/23/2020  Medication Sig   Misc Natural Products (OSTEO BI-FLEX JOINT SHIELD PO) Take by mouth.   Multiple Vitamins-Minerals (MULTIVITAMIN ADULTS) TABS Take by mouth.  sertraline (ZOLOFT) 100 MG tablet TAKE 1 TABLET BY MOUTH ONCE DAILY . APPOINTMENT REQUIRED FOR FUTURE REFILLS   traZODone (DESYREL) 50 MG tablet TAKE 1/2-1 TABLET BY MOUTH AT BEDTIME AS NEEDED FOR SLEEP   [DISCONTINUED] LORazepam (ATIVAN) 1 MG tablet TAKE 1/2 (ONE-HALF) TABLET BY MOUTH TWICE DAILY AS NEEDED FOR ANXIETY   No facility-administered encounter medications on file as of 11/23/2020.    Follow-up: Return in about 6 months (around 05/23/2021) for follow up.   Jean Sax, MD

## 2020-11-23 NOTE — Progress Notes (Signed)
Patient is here to meet PCP and medication refill.

## 2021-05-17 ENCOUNTER — Other Ambulatory Visit: Payer: Self-pay | Admitting: Family Medicine

## 2021-05-17 DIAGNOSIS — F331 Major depressive disorder, recurrent, moderate: Secondary | ICD-10-CM

## 2021-05-17 DIAGNOSIS — F411 Generalized anxiety disorder: Secondary | ICD-10-CM

## 2021-05-17 DIAGNOSIS — G4700 Insomnia, unspecified: Secondary | ICD-10-CM

## 2021-05-25 ENCOUNTER — Other Ambulatory Visit: Payer: Self-pay | Admitting: Family Medicine

## 2021-05-25 DIAGNOSIS — G4700 Insomnia, unspecified: Secondary | ICD-10-CM

## 2021-05-25 NOTE — Telephone Encounter (Signed)
Medication Refill - Medication: traZODone (DESYREL) 50 MG tablet  Has the patient contacted their pharmacy? Yes.   Can you send a 30 day of this medication?  Pt has made appt for 6/27 and that will give her enough to get to this appt.  Preferred Pharmacy (with phone number or street name): [facility] ([facility] [place] Has the patient been seen for an appointment in the last year OR does the patient have an upcoming appointment? Yes.    Agent: Please be advised that RX refills may take up to 3 business days. We ask that you follow-up with your pharmacy.

## 2021-05-26 MED ORDER — TRAZODONE HCL 50 MG PO TABS: ORAL_TABLET | ORAL | 0 refills | Status: DC

## 2021-05-26 NOTE — Telephone Encounter (Signed)
Requested Prescriptions  Pending Prescriptions Disp Refills  . traZODone (DESYREL) 50 MG tablet 90 tablet 1    Sig: TAKE 1/2-1 TABLET BY MOUTH AT BEDTIME AS NEEDED FOR SLEEP     Psychiatry: Antidepressants - Serotonin Modulator Failed - 05/25/2021  1:11 PM      Failed - Valid encounter within last 6 months    Recent Outpatient Visits          6 months ago Moderate episode of recurrent major depressive disorder [facility])   Primary Care at [facility], MD   9 months ago Moderate episode of recurrent major depressive disorder [facility])   Primary Care at [facility], Amy J, NP   1 year ago Moderate episode of recurrent major depressive disorder [facility])   Primary Care at [facility], [place], PA-C   1 year ago Moderate episode of recurrent major depressive disorder [facility])   Primary Care at [facility], Bayard Beaver, MD   1 year ago GAD (generalized anxiety disorder)   Primary Care at [facility], LCSW      Future Appointments            In 1 month Dorna Mai, MD Primary Care at [facility]

## 2021-06-29 ENCOUNTER — Ambulatory Visit (INDEPENDENT_AMBULATORY_CARE_PROVIDER_SITE_OTHER): Payer: Commercial Managed Care - HMO | Admitting: Family Medicine

## 2021-06-29 ENCOUNTER — Encounter: Payer: Self-pay | Admitting: Family Medicine

## 2021-06-29 VITALS — BP 118/67 | HR 87 | Temp 98.1°F | Resp 16 | Wt 169.0 lb

## 2021-06-29 DIAGNOSIS — F331 Major depressive disorder, recurrent, moderate: Secondary | ICD-10-CM | POA: Diagnosis not present

## 2021-06-29 DIAGNOSIS — F411 Generalized anxiety disorder: Secondary | ICD-10-CM | POA: Diagnosis not present

## 2021-06-29 DIAGNOSIS — G4700 Insomnia, unspecified: Secondary | ICD-10-CM

## 2021-06-29 MED ORDER — TRAZODONE HCL 50 MG PO TABS: ORAL_TABLET | ORAL | 1 refills | Status: DC

## 2021-06-29 MED ORDER — SERTRALINE HCL 100 MG PO TABS: 100.0000 mg | ORAL_TABLET | Freq: Every day | ORAL | 1 refills | Status: DC

## 2021-06-29 NOTE — Progress Notes (Signed)
Patient is here for medication refill  Patient has no other concerns

## 2021-06-30 ENCOUNTER — Encounter: Payer: Self-pay | Admitting: Family Medicine

## 2021-06-30 NOTE — Progress Notes (Signed)
Established Patient Office Visit  Subjective    Patient ID: Jean Palmer, female    DOB: [birth year]  Age: 56 y.o. MRN: [id number]  CC:  Chief Complaint  Patient presents with   Medication Refill    HPI Jean Palmer presents for routine follow up of chronic med issues. She denies acute complaints or concerns.    Outpatient Encounter Medications as of 06/29/2021  Medication Sig   Misc Natural Products (OSTEO BI-FLEX JOINT SHIELD PO) Take by mouth.   Multiple Vitamins-Minerals (MULTIVITAMIN ADULTS) TABS Take by mouth.   [DISCONTINUED] sertraline (ZOLOFT) 100 MG tablet TAKE 1 TABLET BY MOUTH ONCE DAILY . APPOINTMENT REQUIRED FOR FUTURE REFILLS   [DISCONTINUED] traZODone (DESYREL) 50 MG tablet TAKE 1/2-1 TABLET BY MOUTH AT BEDTIME AS NEEDED FOR SLEEP   sertraline (ZOLOFT) 100 MG tablet Take 1 tablet (100 mg total) by mouth at bedtime.   traZODone (DESYREL) 50 MG tablet TAKE 1/2-1 TABLET BY MOUTH AT BEDTIME AS NEEDED FOR SLEEP   No facility-administered encounter medications on file as of 06/29/2021.    Past Medical History:  Diagnosis Date   Alcohol abuse    Scoliosis     Past Surgical History:  Procedure Laterality Date   SPINAL FUSION     THERAPEUTIC ABORTION      Family History  Adopted: Yes    Social History   Socioeconomic History   Marital status: Single    Spouse name: Not on file   Number of children: Not on file   Years of education: Not on file   Highest education level: Not on file  Occupational History   Not on file  Tobacco Use   Smoking status: Former    Packs/day: 0.50    Types: Cigarettes    Quit date: 05/03/2021    Years since quitting: 0.1   Smokeless tobacco: Never  Vaping Use   Vaping Use: Never used  Substance and Sexual Activity   Alcohol use: Not Currently   Drug use: Not Currently   Sexual activity: Not on file  Other Topics Concern   Not on file  Social History Narrative   Not on file   Social Determinants of Health    Financial Resource Strain: Not on file  Food Insecurity: Not on file  Transportation Needs: Not on file  Physical Activity: Not on file  Stress: Not on file  Social Connections: Not on file  Intimate Partner Violence: Not on file    Review of Systems  All other systems reviewed and are negative.       Objective    BP 118/67   Pulse 87   Temp 98.1 F (36.7 C) (Oral)   Resp 16   Wt 169 lb (76.7 kg)   SpO2 96%   BMI 29.94 kg/m   Physical Exam Vitals and nursing note reviewed.  Constitutional:      General: She is not in acute distress. Cardiovascular:     Rate and Rhythm: Normal rate and regular rhythm.  Pulmonary:     Effort: Pulmonary effort is normal.     Breath sounds: Normal breath sounds.  Neurological:     General: No focal deficit present.     Mental Status: She is alert and oriented to person, place, and time.  Psychiatric:        Mood and Affect: Mood normal.        Behavior: Behavior normal.         Assessment & Plan:  1. Moderate episode of recurrent major depressive disorder (HCC) Appears stable. Continue and monitor - sertraline (ZOLOFT) 100 MG tablet; Take 1 tablet (100 mg total) by mouth at bedtime.  Dispense: 90 tablet; Refill: 1  2. Generalized anxiety disorder Appears stable. Continue and monitor - sertraline (ZOLOFT) 100 MG tablet; Take 1 tablet (100 mg total) by mouth at bedtime.  Dispense: 90 tablet; Refill: 1  3. Insomnia, unspecified type Continue and monitor - traZODone (DESYREL) 50 MG tablet; TAKE 1/2-1 TABLET BY MOUTH AT BEDTIME AS NEEDED FOR SLEEP  Dispense: 90 tablet; Refill: 1    Return in about 6 months (around 12/29/2021) for follow up.   Becky Sax, MD

## 2021-12-08 IMAGING — MG DIGITAL DIAGNOSTIC BILAT W/ TOMO W/ CAD
6 of 10 series · 6 of 30 positions shown · non-contrast
Comparison: No previous exams.

CLINICAL DATA: Palpable mass associated with tenderness in the
LOWER INNER QUADRANT of the LEFT. Mass comes and goes. Symptoms for
1 year.

EXAM:
DIGITAL DIAGNOSTIC BILATERAL MAMMOGRAM WITH CAD AND TOMO
ULTRASOUND LEFT BREAST

[L CC synth-2D]
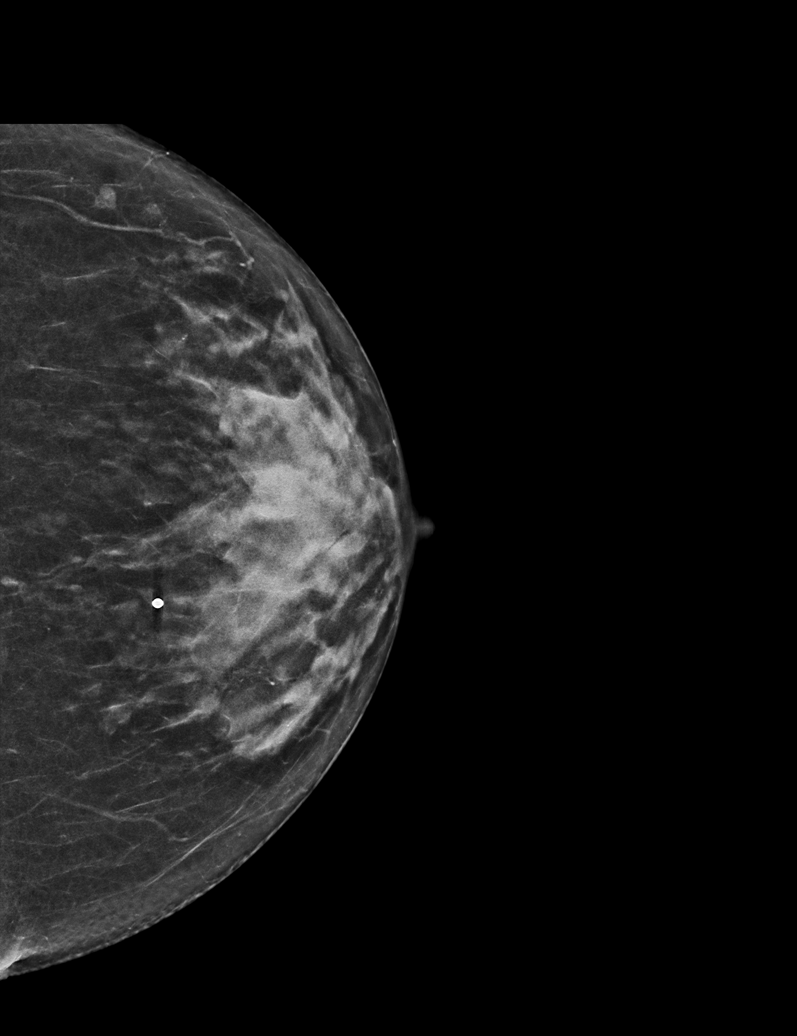

[L MLO synth-2D]
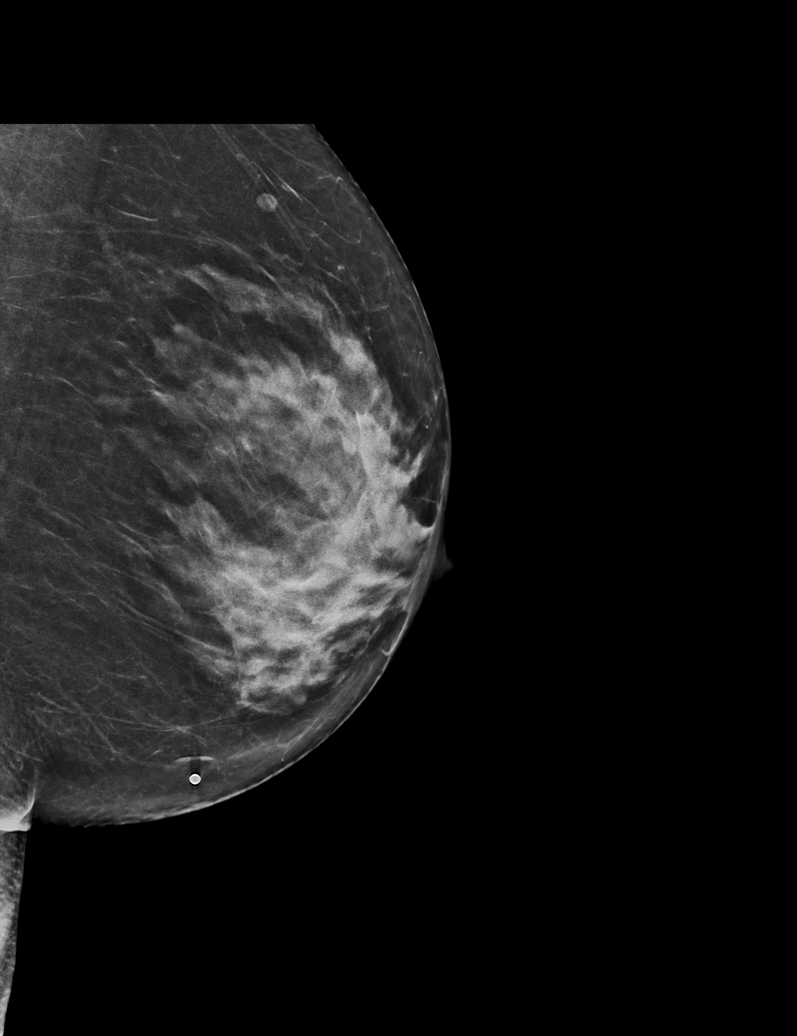

[L TAN synth-2D]
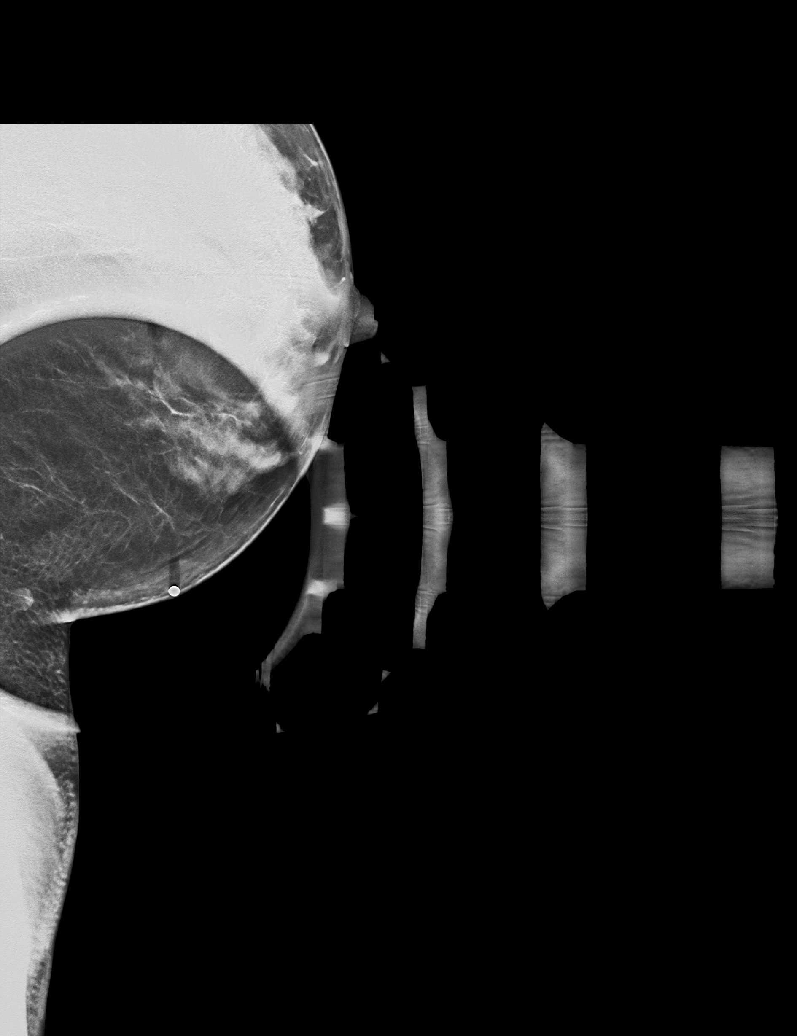

[R CC synth-2D]
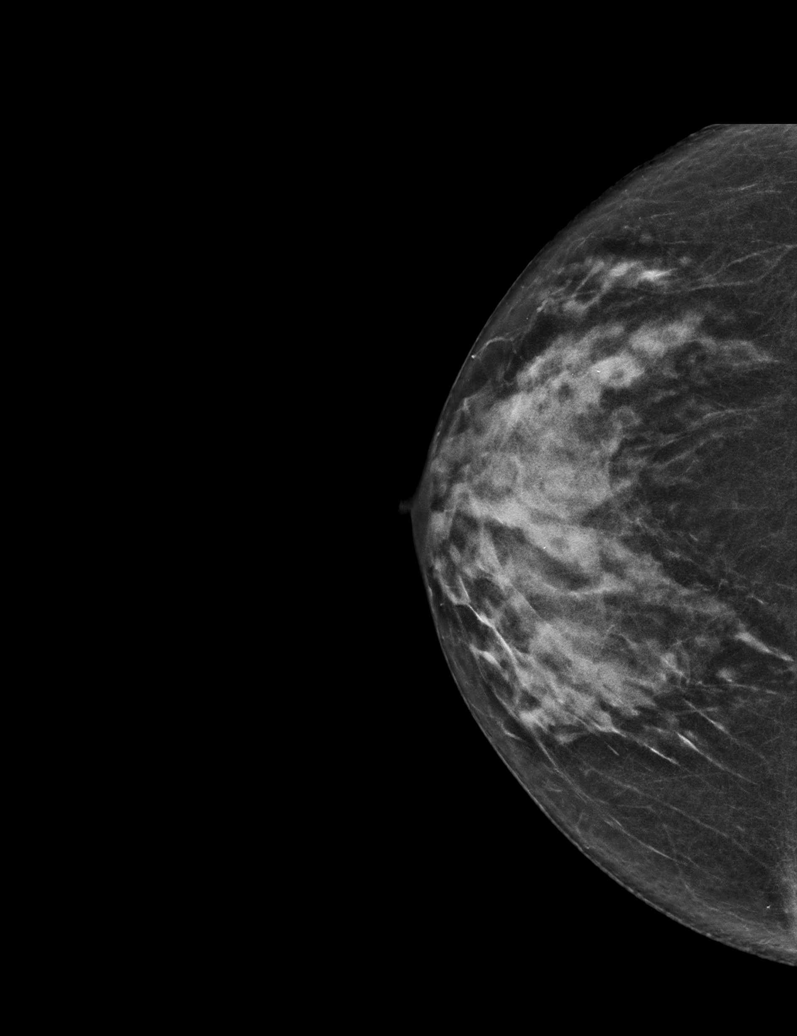

[R MLO synth-2D]
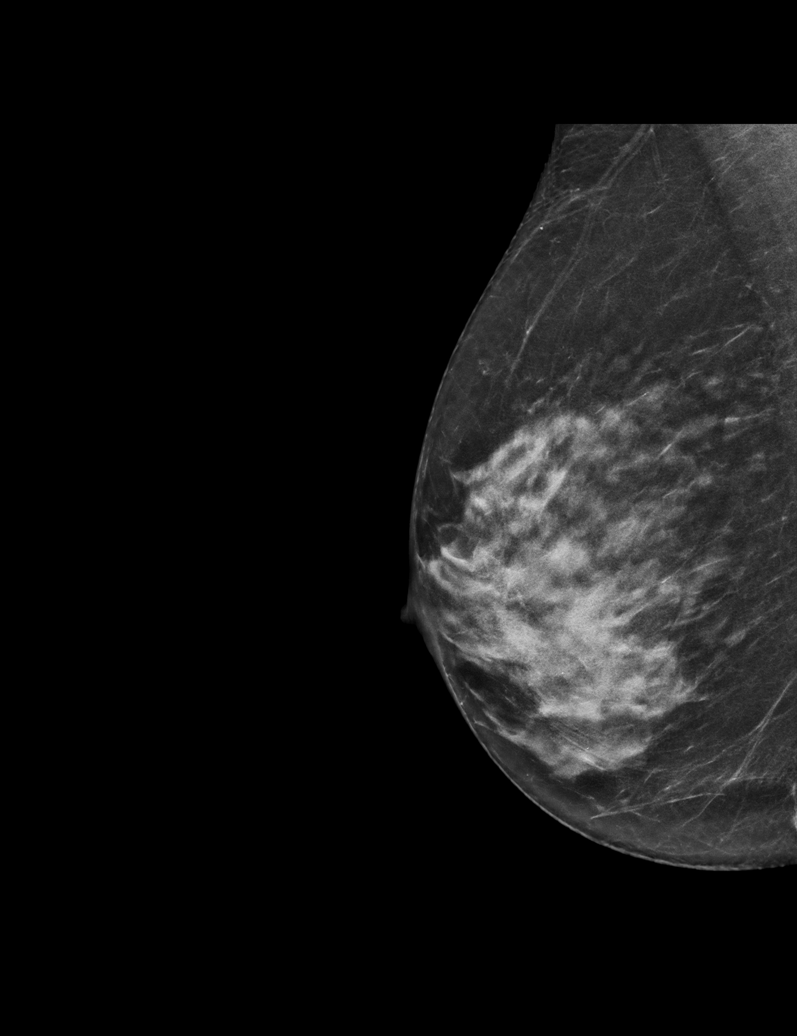

[R CC tomo · tomo slice 27/54.0]
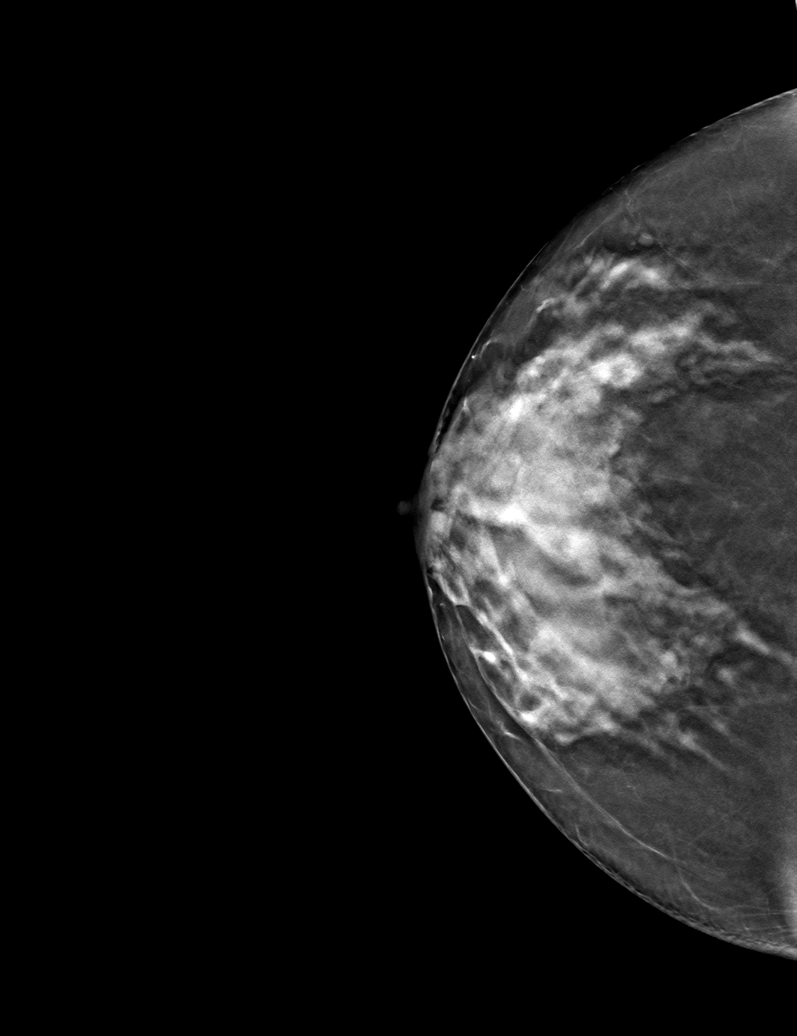

[6 of 30 positions shown; findings below may reference images not displayed]

ACR Breast Density Category c: The breast tissue is heterogeneously
dense, which may obscure small masses.
FINDINGS: No suspicious mass, distortion, or microcalcifications are
identified to suggest presence of malignancy.

Mammographic images were processed with CAD.

On physical exam, I palpate soft nodularity in LOWER INNER QUADRANT
of the LEFT breast. I palpate no discrete mass. Patient is focally
tender in the 7 o'clock location during physical exam.

Targeted ultrasound is performed, showing normal appearing dense
fibroglandular tissue in the LOWER INNER QUADRANT of the LEFT
breast. No suspicious mass, distortion, or acoustic shadowing is
demonstrated with ultrasound.
IMPRESSION: No mammographic or ultrasound evidence for malignancy.

RECOMMENDATION:
Screening mammogram in one year.(Code:YI-D-5GJ)

I have discussed the findings and recommendations with the patient.
If applicable, a reminder letter will be sent to the patient
regarding the next appointment.

BI-RADS CATEGORY  1: Negative.

## 2021-12-08 IMAGING — US US BREAST*L* LIMITED INC AXILLA
1 series · 2 of 2 positions shown · non-contrast
Comparison: No previous exams.

CLINICAL DATA: Palpable mass associated with tenderness in the
LOWER INNER QUADRANT of the LEFT. Mass comes and goes. Symptoms for
1 year.

EXAM:
DIGITAL DIAGNOSTIC BILATERAL MAMMOGRAM WITH CAD AND TOMO
ULTRASOUND LEFT BREAST

[Series 1: us breast*left* limited inc axilla · 0.07mm/px · 2 of 2 slices shown]
[im 1/2]
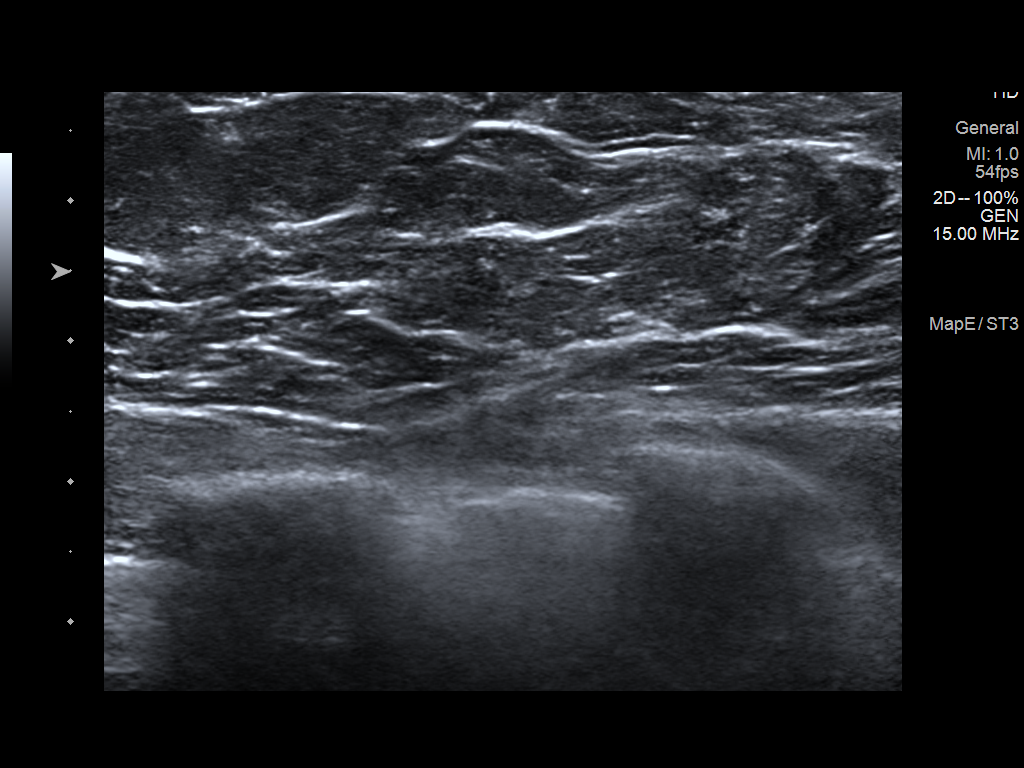
[im 2/2]
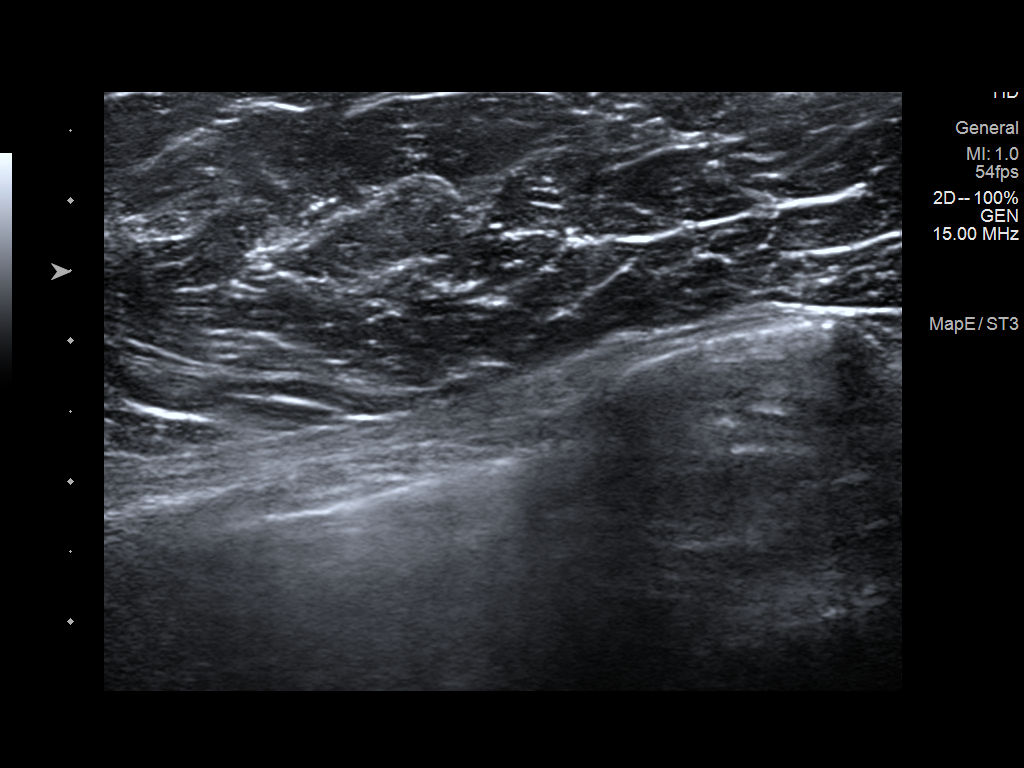

[2 of 2 positions shown; findings below may reference images not displayed]

ACR Breast Density Category c: The breast tissue is heterogeneously
dense, which may obscure small masses.
FINDINGS: No suspicious mass, distortion, or microcalcifications are
identified to suggest presence of malignancy.

Mammographic images were processed with CAD.

On physical exam, I palpate soft nodularity in LOWER INNER QUADRANT
of the LEFT breast. I palpate no discrete mass. Patient is focally
tender in the 7 o'clock location during physical exam.

Targeted ultrasound is performed, showing normal appearing dense
fibroglandular tissue in the LOWER INNER QUADRANT of the LEFT
breast. No suspicious mass, distortion, or acoustic shadowing is
demonstrated with ultrasound.
IMPRESSION: No mammographic or ultrasound evidence for malignancy.

RECOMMENDATION:
Screening mammogram in one year.(Code:YI-D-5GJ)

I have discussed the findings and recommendations with the patient.
If applicable, a reminder letter will be sent to the patient
regarding the next appointment.

BI-RADS CATEGORY  1: Negative.

## 2021-12-09 ENCOUNTER — Ambulatory Visit (INDEPENDENT_AMBULATORY_CARE_PROVIDER_SITE_OTHER): Payer: Commercial Managed Care - HMO | Admitting: Family Medicine

## 2021-12-09 ENCOUNTER — Encounter: Payer: Self-pay | Admitting: Family Medicine

## 2021-12-09 VITALS — BP 119/80 | HR 82 | Temp 98.1°F | Resp 16 | Wt 173.2 lb

## 2021-12-09 DIAGNOSIS — G4700 Insomnia, unspecified: Secondary | ICD-10-CM | POA: Diagnosis not present

## 2021-12-09 DIAGNOSIS — F411 Generalized anxiety disorder: Secondary | ICD-10-CM | POA: Diagnosis not present

## 2021-12-09 DIAGNOSIS — F331 Major depressive disorder, recurrent, moderate: Secondary | ICD-10-CM

## 2021-12-09 DIAGNOSIS — R631 Polydipsia: Secondary | ICD-10-CM

## 2021-12-09 DIAGNOSIS — M79672 Pain in left foot: Secondary | ICD-10-CM

## 2021-12-09 DIAGNOSIS — M79671 Pain in right foot: Secondary | ICD-10-CM

## 2021-12-09 DIAGNOSIS — R32 Unspecified urinary incontinence: Secondary | ICD-10-CM

## 2021-12-09 MED ORDER — SERTRALINE HCL 100 MG PO TABS: 100.0000 mg | ORAL_TABLET | Freq: Every day | ORAL | 1 refills | Status: DC

## 2021-12-09 MED ORDER — TRAZODONE HCL 50 MG PO TABS: ORAL_TABLET | ORAL | 1 refills | Status: DC

## 2021-12-09 MED ORDER — TOLTERODINE TARTRATE 1 MG PO TABS: 1.0000 mg | ORAL_TABLET | Freq: Two times a day (BID) | ORAL | 1 refills | Status: AC

## 2021-12-09 NOTE — Progress Notes (Signed)
Patient is here for their 6 month follow-up Patient has no concerns today Care gaps have been discussed with patient  

## 2021-12-13 ENCOUNTER — Encounter: Payer: Self-pay | Admitting: Family Medicine

## 2021-12-13 NOTE — Progress Notes (Signed)
Established Patient Office Visit  Subjective    Patient ID: Jean Palmer, female    DOB: 25-Dec-1965  Age: 56 y.o. MRN: [id number]  CC:  Chief Complaint  Patient presents with   Follow-up    6 month    HPI LEVERN KALKA presents for follow up of chronic med issues. Patient also complains of bilateral foot pain. Patient denies known trauma or injury.    Outpatient Encounter Medications as of 12/09/2021  Medication Sig   Misc Natural Products (OSTEO BI-FLEX JOINT SHIELD PO) Take by mouth.   Multiple Vitamins-Minerals (MULTIVITAMIN ADULTS) TABS Take by mouth.   tolterodine (DETROL) 1 MG tablet Take 1 tablet (1 mg total) by mouth 2 (two) times daily.   [DISCONTINUED] sertraline (ZOLOFT) 100 MG tablet Take 1 tablet (100 mg total) by mouth at bedtime.   [DISCONTINUED] traZODone (DESYREL) 50 MG tablet TAKE 1/2-1 TABLET BY MOUTH AT BEDTIME AS NEEDED FOR SLEEP   sertraline (ZOLOFT) 100 MG tablet Take 1 tablet (100 mg total) by mouth at bedtime.   traZODone (DESYREL) 50 MG tablet TAKE 1/2-1 TABLET BY MOUTH AT BEDTIME AS NEEDED FOR SLEEP   No facility-administered encounter medications on file as of 12/09/2021.    Past Medical History:  Diagnosis Date   Alcohol abuse    Scoliosis     Past Surgical History:  Procedure Laterality Date   SPINAL FUSION     THERAPEUTIC ABORTION      Family History  Adopted: Yes    Social History   Socioeconomic History   Marital status: Single    Spouse name: Not on file   Number of children: Not on file   Years of education: Not on file   Highest education level: Not on file  Occupational History   Not on file  Tobacco Use   Smoking status: Former    Packs/day: 0.50    Types: Cigarettes    Quit date: 05/03/2021    Years since quitting: 0.6   Smokeless tobacco: Never  Vaping Use   Vaping Use: Never used  Substance and Sexual Activity   Alcohol use: Not Currently   Drug use: Not Currently   Sexual activity: Not on file  Other Topics  Concern   Not on file  Social History Narrative   Not on file   Social Determinants of Health   Financial Resource Strain: Not on file  Food Insecurity: Not on file  Transportation Needs: Not on file  Physical Activity: Not on file  Stress: Not on file  Social Connections: Not on file  Intimate Partner Violence: Not on file    Review of Systems  Genitourinary:  Positive for frequency and urgency. Negative for dysuria.  All other systems reviewed and are negative.       Objective    BP 119/80   Pulse 82   Temp 98.1 F (36.7 C) (Oral)   Resp 16   Wt 173 lb 3.2 oz (78.6 kg)   SpO2 94%   BMI 30.68 kg/m   Physical Exam Vitals and nursing note reviewed.  Constitutional:      General: She is not in acute distress. Cardiovascular:     Rate and Rhythm: Normal rate and regular rhythm.  Pulmonary:     Effort: Pulmonary effort is normal.     Breath sounds: Normal breath sounds.  Neurological:     General: No focal deficit present.     Mental Status: She is alert and oriented to person,  place, and time.  Psychiatric:        Mood and Affect: Mood normal.        Behavior: Behavior normal.         Assessment & Plan:   1. Moderate episode of recurrent major depressive disorder (HCC) Appears stable. Meds refilled. continue - sertraline (ZOLOFT) 100 MG tablet; Take 1 tablet (100 mg total) by mouth at bedtime.  Dispense: 90 tablet; Refill: 1  2. Insomnia, unspecified type Meds refilled. 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   4. Bilateral foot pain Referral to podiatry for further eval/mgt - Ambulatory referral to Podiatry  5. Generalized anxiety disorder Meds refilled - sertraline (ZOLOFT) 100 MG tablet; Take 1 tablet (100 mg total) by mouth at bedtime.  Dispense: 90 tablet; Refill: 1  6. Incontinence in female Detrol  prescribed. monitor    No follow-ups on file.   Becky Sax, MD

## 2022-03-10 ENCOUNTER — Ambulatory Visit: Payer: Commercial Managed Care - HMO | Admitting: Family Medicine

## 2022-03-30 ENCOUNTER — Ambulatory Visit: Payer: Commercial Managed Care - HMO | Admitting: Family Medicine

## 2022-04-27 ENCOUNTER — Encounter: Payer: Self-pay | Admitting: Family Medicine

## 2022-04-27 ENCOUNTER — Ambulatory Visit: Payer: Medicaid Other | Admitting: Family Medicine

## 2022-04-27 VITALS — BP 129/89 | HR 109 | Temp 97.7°F | Resp 16

## 2022-04-27 DIAGNOSIS — R32 Unspecified urinary incontinence: Secondary | ICD-10-CM | POA: Diagnosis not present

## 2022-04-27 DIAGNOSIS — E785 Hyperlipidemia, unspecified: Secondary | ICD-10-CM | POA: Diagnosis not present

## 2022-04-27 DIAGNOSIS — F419 Anxiety disorder, unspecified: Secondary | ICD-10-CM

## 2022-04-27 DIAGNOSIS — F17200 Nicotine dependence, unspecified, uncomplicated: Secondary | ICD-10-CM | POA: Diagnosis not present

## 2022-04-27 DIAGNOSIS — F32A Depression, unspecified: Secondary | ICD-10-CM | POA: Diagnosis not present

## 2022-04-27 NOTE — Progress Notes (Signed)
Patient is here for their 6 month follow-up Patient has no concerns today Care gaps have been discussed with patient  

## 2022-05-02 ENCOUNTER — Encounter: Payer: Self-pay | Admitting: Family Medicine

## 2022-05-02 NOTE — Progress Notes (Signed)
Established Patient Office Visit  Subjective    Patient ID: Jean Palmer, female    DOB: [birth year]  Age: 57 y.o. MRN: [id number]  CC:  Chief Complaint  Patient presents with   Follow-up    HPI Jean Palmer presents for follow up of chronic med issues.    Outpatient Encounter Medications as of 04/27/2022  Medication Sig   Misc Natural Products (OSTEO BI-FLEX JOINT SHIELD PO) Take by mouth.   Multiple Vitamins-Minerals (MULTIVITAMIN ADULTS) TABS Take by mouth.   sertraline (ZOLOFT) 100 MG tablet Take 1 tablet (100 mg total) by mouth at bedtime.   tolterodine (DETROL) 1 MG tablet Take 1 tablet (1 mg total) by mouth 2 (two) times daily.   traZODone (DESYREL) 50 MG tablet TAKE 1/2-1 TABLET BY MOUTH AT BEDTIME AS NEEDED FOR SLEEP   No facility-administered encounter medications on file as of 04/27/2022.    Past Medical History:  Diagnosis Date   Alcohol abuse    Scoliosis     Past Surgical History:  Procedure Laterality Date   SPINAL FUSION     THERAPEUTIC ABORTION      Family History  Adopted: Yes    Social History   Socioeconomic History   Marital status: Single    Spouse name: Not on file   Number of children: Not on file   Years of education: Not on file   Highest education level: Not on file  Occupational History   Not on file  Tobacco Use   Smoking status: Former    Packs/day: .5    Types: Cigarettes    Quit date: 05/03/2021    Years since quitting: 0.9   Smokeless tobacco: Never  Vaping Use   Vaping Use: Never used  Substance and Sexual Activity   Alcohol use: Not Currently   Drug use: Not Currently   Sexual activity: Not on file  Other Topics Concern   Not on file  Social History Narrative   Not on file   Social Determinants of Health   Financial Resource Strain: Medium Risk (04/27/2022)   Overall Financial Resource Strain (CARDIA)    Difficulty of Paying Living Expenses: Somewhat hard  Food Insecurity: No Food Insecurity (04/27/2022)    Hunger Vital Sign    Worried About Running Out of Food in the Last Year: Never true    Ran Out of Food in the Last Year: Never true  Transportation Needs: No Transportation Needs (04/27/2022)   PRAPARE - Administrator, Civil Service (Medical): No    Lack of Transportation (Non-Medical): No  Physical Activity: Insufficiently Active (04/27/2022)   Exercise Vital Sign    Days of Exercise per Week: 3 days    Minutes of Exercise per Session: 20 min  Stress: No Stress Concern Present (04/27/2022)   [employer] of Occupational Health - Occupational Stress Questionnaire    Feeling of Stress : Not at all  Social Connections: Unknown (04/27/2022)   Social Connection and Isolation Panel [NHANES]    Frequency of Communication with Friends and Family: Three times a week    Frequency of Social Gatherings with Friends and Family: Three times a week    Attends Religious Services: More than 4 times per year    Active Member of Clubs or Organizations: No    Attends Banker Meetings: Never    Marital Status: Not on file  Intimate Partner Violence: Not At Risk (04/27/2022)   Humiliation, Afraid, Rape, and Kick questionnaire  Fear of Current or Ex-Partner: No    Emotionally Abused: No    Physically Abused: No    Sexually Abused: No    Review of Systems  All other systems reviewed and are negative.       Objective    BP 129/89   Pulse (!) 109   Temp 97.7 F (36.5 C) (Oral)   Resp 16   SpO2 92%   Physical Exam Vitals and nursing note reviewed.  Constitutional:      General: She is not in acute distress. Cardiovascular:     Rate and Rhythm: Normal rate and regular rhythm.  Pulmonary:     Effort: Pulmonary effort is normal.     Breath sounds: Normal breath sounds.  Neurological:     General: No focal deficit present.     Mental Status: She is alert and oriented to person, place, and time.  Psychiatric:        Mood and Affect: Mood is anxious.         Behavior: Behavior normal.         Assessment & Plan:   1. Anxiety and depression Continue present management  2. Hyperlipidemia, unspecified hyperlipidemia type Continue   3. Incontinence in female Patient defers further eval/mgt at this time. Recommended scheduled bathroom use.   4. Tobacco use disorder Discussed reduction/cessation    Return in about 6 months (around 10/27/2022) for follow up.   Tommie Raymond, MD

## 2022-06-24 ENCOUNTER — Other Ambulatory Visit: Payer: Self-pay | Admitting: Family Medicine

## 2022-06-24 DIAGNOSIS — F331 Major depressive disorder, recurrent, moderate: Secondary | ICD-10-CM

## 2022-06-24 DIAGNOSIS — F411 Generalized anxiety disorder: Secondary | ICD-10-CM

## 2022-06-24 DIAGNOSIS — G4700 Insomnia, unspecified: Secondary | ICD-10-CM

## 2022-09-27 ENCOUNTER — Ambulatory Visit: Payer: Medicaid Other | Admitting: Family Medicine

## 2022-09-27 ENCOUNTER — Encounter: Payer: Self-pay | Admitting: Family Medicine

## 2022-09-27 DIAGNOSIS — F411 Generalized anxiety disorder: Secondary | ICD-10-CM

## 2022-09-27 DIAGNOSIS — F331 Major depressive disorder, recurrent, moderate: Secondary | ICD-10-CM

## 2022-09-27 MED ORDER — SERTRALINE HCL 100 MG PO TABS: 100.0000 mg | ORAL_TABLET | Freq: Every day | ORAL | 1 refills | Status: DC

## 2022-09-28 ENCOUNTER — Encounter: Payer: Self-pay | Admitting: Family Medicine

## 2022-09-28 NOTE — Progress Notes (Signed)
Established Patient Office Visit  Subjective    Patient ID: Jean Palmer, female    DOB: [birth year]  Age: 57 y.o. MRN: [id number]  CC:  Chief Complaint  Patient presents with   Medication Refill    HPI Jean Palmer presents for follow up of chronic med issues. Patient denies acute complaints.   Outpatient Encounter Medications as of 09/27/2022  Medication Sig   Misc Natural Products (OSTEO BI-FLEX JOINT SHIELD PO) Take by mouth.   Multiple Vitamins-Minerals (MULTIVITAMIN ADULTS) TABS Take by mouth.   tolterodine (DETROL) 1 MG tablet Take 1 tablet (1 mg total) by mouth 2 (two) times daily.   traZODone (DESYREL) 50 MG tablet TAKE 1/2 TO 1 (ONE-HALF TO ONE) TABLET BY MOUTH AT BEDTIME AS NEEDED FOR SLEEP   [DISCONTINUED] sertraline (ZOLOFT) 100 MG tablet TAKE 1 TABLET BY MOUTH AT BEDTIME   sertraline (ZOLOFT) 100 MG tablet Take 1 tablet (100 mg total) by mouth at bedtime.   No facility-administered encounter medications on file as of 09/27/2022.    Past Medical History:  Diagnosis Date   Alcohol abuse    Scoliosis     Past Surgical History:  Procedure Laterality Date   SPINAL FUSION     THERAPEUTIC ABORTION      Family History  Adopted: Yes    Social History   Socioeconomic History   Marital status: Single    Spouse name: Not on file   Number of children: Not on file   Years of education: Not on file   Highest education level: Not on file  Occupational History   Not on file  Tobacco Use   Smoking status: Former    Current packs/day: 0.00    Types: Cigarettes    Quit date: 05/03/2021    Years since quitting: 1.4   Smokeless tobacco: Never  Vaping Use   Vaping status: Never Used  Substance and Sexual Activity   Alcohol use: Not Currently   Drug use: Not Currently   Sexual activity: Not on file  Other Topics Concern   Not on file  Social History Narrative   Not on file   Social Determinants of Health   Financial Resource Strain: Medium Risk  (04/27/2022)   Overall Financial Resource Strain (CARDIA)    Difficulty of Paying Living Expenses: Somewhat hard  Food Insecurity: No Food Insecurity (04/27/2022)   Hunger Vital Sign    Worried About Running Out of Food in the Last Year: Never true    Ran Out of Food in the Last Year: Never true  Transportation Needs: No Transportation Needs (04/27/2022)   PRAPARE - Administrator, Civil Service (Medical): No    Lack of Transportation (Non-Medical): No  Physical Activity: Insufficiently Active (04/27/2022)   Exercise Vital Sign    Days of Exercise per Week: 3 days    Minutes of Exercise per Session: 20 min  Stress: No Stress Concern Present (04/27/2022)   [employer] of Occupational Health - Occupational Stress Questionnaire    Feeling of Stress : Not at all  Social Connections: Unknown (04/27/2022)   Social Connection and Isolation Panel [NHANES]    Frequency of Communication with Friends and Family: Three times a week    Frequency of Social Gatherings with Friends and Family: Three times a week    Attends Religious Services: More than 4 times per year    Active Member of Clubs or Organizations: No    Attends Banker Meetings: Never  Marital Status: Not on file  Intimate Partner Violence: Not At Risk (04/27/2022)   Humiliation, Afraid, Rape, and Kick questionnaire    Fear of Current or Ex-Partner: No    Emotionally Abused: No    Physically Abused: No    Sexually Abused: No    Review of Systems  All other systems reviewed and are negative.       Objective    BP 129/88   Pulse 84   Temp 98.1 F (36.7 C) (Oral)   Resp 16   Ht 5' 2.75" (1.594 m)   Wt 173 lb (78.5 kg)   SpO2 93%   BMI 30.89 kg/m   Physical Exam Vitals and nursing note reviewed.  Constitutional:      General: She is not in acute distress. Cardiovascular:     Rate and Rhythm: Normal rate and regular rhythm.  Pulmonary:     Effort: Pulmonary effort is normal.     Breath  sounds: Normal breath sounds.  Neurological:     General: No focal deficit present.     Mental Status: She is alert and oriented to person, place, and time.  Psychiatric:        Mood and Affect: Mood normal.        Behavior: Behavior normal.         Assessment & Plan:   Moderate episode of recurrent major depressive disorder (HCC) -     Sertraline HCl; Take 1 tablet (100 mg total) by mouth at bedtime.  Dispense: 90 tablet; Refill: 1  Generalized anxiety disorder -     Sertraline HCl; Take 1 tablet (100 mg total) by mouth at bedtime.  Dispense: 90 tablet; Refill: 1     Return in about 3 months (around 12/27/2022) for follow up.   Tommie Raymond, MD

## 2022-09-29 ENCOUNTER — Other Ambulatory Visit: Payer: Self-pay | Admitting: Family Medicine

## 2022-09-29 ENCOUNTER — Telehealth: Payer: Self-pay | Admitting: *Deleted

## 2022-09-29 DIAGNOSIS — G4700 Insomnia, unspecified: Secondary | ICD-10-CM

## 2022-09-29 NOTE — Telephone Encounter (Signed)
Seen provider

## 2022-12-19 ENCOUNTER — Other Ambulatory Visit: Payer: Self-pay | Admitting: Family Medicine

## 2022-12-19 DIAGNOSIS — G4700 Insomnia, unspecified: Secondary | ICD-10-CM

## 2023-03-21 ENCOUNTER — Other Ambulatory Visit: Payer: Self-pay | Admitting: Family Medicine

## 2023-03-21 DIAGNOSIS — F411 Generalized anxiety disorder: Secondary | ICD-10-CM

## 2023-03-21 DIAGNOSIS — G4700 Insomnia, unspecified: Secondary | ICD-10-CM

## 2023-03-21 DIAGNOSIS — F331 Major depressive disorder, recurrent, moderate: Secondary | ICD-10-CM

## 2023-03-21 NOTE — Telephone Encounter (Signed)
 Copied from CRM [id number]. Topic: Clinical - Medication Refill >> Mar 21, 2023  8:51 AM Fuller Mandril wrote: Most Recent Primary Care Visit:  Provider: Georganna Skeans  Department: [facility] [place]  Visit Type: OFFICE VISIT  Date: 09/27/2022  Medication:  sertraline (ZOLOFT) 100 MG tablet traZODone (DESYREL) 50 MG tablet  Has the patient contacted their pharmacy? Yes (Agent: If no, request that the patient contact the pharmacy for the refill. If patient does not wish to contact the pharmacy document the reason why and proceed with request.) (Agent: If yes, when and what did the pharmacy advise?) out of refills  Is this the correct pharmacy for this prescription? Yes If no, delete pharmacy and type the correct one.  This is the patient's preferred pharmacy:  [facility] [address] ([address] [place] - [address] [place] ([place]) [place] [address] Phone: [phone] Fax: [phone]   Has the prescription been filled recently? No  Is the patient out of the medication? No - 6 days left   Has the patient been seen for an appointment in the last year OR does the patient have an upcoming appointment? Yes - scheduled for virtual April 23rd   Can we respond through MyChart? No  Agent: Please be advised that Rx refills may take up to 3 business days. We ask that you follow-up with your pharmacy.

## 2023-03-22 MED ORDER — TRAZODONE HCL 50 MG PO TABS
ORAL_TABLET | ORAL | 0 refills | Status: DC
Start: 2023-03-22 — End: 2023-06-26

## 2023-03-22 NOTE — Telephone Encounter (Signed)
 Requested Prescriptions  Pending Prescriptions Disp Refills   traZODone (DESYREL) 50 MG tablet 90 tablet 0    Sig: TAKE 1/2 TO 1 (ONE-HALF TO ONE) TABLET BY MOUTH ONCE DAILY AT BEDTIME AS NEEDED FOR SLEEP     Psychiatry: Antidepressants - Serotonin Modulator Passed - 03/22/2023 12:43 PM      Passed - Valid encounter within last 6 months    Recent Outpatient Visits           5 months ago Moderate episode of recurrent major depressive disorder (HCC)   [place] [facility], MD   10 months ago Anxiety and depression   [place] [facility], Lauris Poag, MD   1 year ago Moderate episode of recurrent major depressive disorder [facility])   [place] [facility], Lauris Poag, MD   1 year ago Moderate episode of recurrent major depressive disorder [facility])   [place] [facility], Lauris Poag, MD   2 years ago Moderate episode of recurrent major depressive disorder [facility])   [place] [facility] Lauris Poag, MD               sertraline (ZOLOFT) 100 MG tablet 90 tablet 1    Sig: Take 1 tablet (100 mg total) by mouth at bedtime.     Psychiatry:  Antidepressants - SSRI - sertraline Failed - 03/22/2023 12:43 PM      Failed - AST in normal range and within 360 days    AST  Date Value Ref Range Status  01/14/2019 32 0 - 40 IU/L Final         Failed - ALT in normal range and within 360 days    ALT  Date Value Ref Range Status  01/14/2019 29 0 - 32 IU/L Final         Passed - Completed PHQ-2 or PHQ-9 in the last 360 days      Passed - Valid encounter within last 6 months    Recent Outpatient Visits           5 months ago Moderate episode of recurrent major depressive disorder (HCC)   [place] [facility], MD   10 months ago Anxiety and depression   [place] [facility], Lauris Poag, MD   1 year ago  Moderate episode of recurrent major depressive disorder [facility])   [place] [facility], MD   1 year ago Moderate episode of recurrent major depressive disorder [facility])    [facility] at [facility], Lauris Poag, MD   2 years ago Moderate episode of recurrent major depressive disorder [facility])    Primary [facility] at [facility], MD

## 2023-03-22 NOTE — Telephone Encounter (Signed)
 Requested medication (s) are due for refill today: Yes  Requested medication (s) are on the active medication list: Yes  Last refill:  09/27/22  Future visit scheduled: No  Notes to clinic:  Unable to refill per protocol due to failed labs, no updated results.      Requested Prescriptions  Pending Prescriptions Disp Refills   sertraline (ZOLOFT) 100 MG tablet 90 tablet 1    Sig: Take 1 tablet (100 mg total) by mouth at bedtime.     Psychiatry:  Antidepressants - SSRI - sertraline Failed - 03/22/2023 12:44 PM      Failed - AST in normal range and within 360 days    AST  Date Value Ref Range Status  01/14/2019 32 0 - 40 IU/L Final         Failed - ALT in normal range and within 360 days    ALT  Date Value Ref Range Status  01/14/2019 29 0 - 32 IU/L Final         Passed - Completed PHQ-2 or PHQ-9 in the last 360 days      Passed - Valid encounter within last 6 months    Recent Outpatient Visits           5 months ago Moderate episode of recurrent major depressive disorder (HCC)   [place] [facility], MD   10 months ago Anxiety and depression   [place] [facility], Lauris Poag, MD   1 year ago Moderate episode of recurrent major depressive disorder [facility])   [place] [facility] at [facility], MD   1 year ago Moderate episode of recurrent major depressive disorder [facility])   [place] [facility] at [facility], Lauris Poag, MD   2 years ago Moderate episode of recurrent major depressive disorder [facility])   [place] [facility] at [facility], Lauris Poag, MD              Signed Prescriptions Disp Refills   traZODone (DESYREL) 50 MG tablet 90 tablet 0    Sig: TAKE 1/2 TO 1 (ONE-HALF TO ONE) TABLET BY MOUTH ONCE DAILY AT BEDTIME AS NEEDED FOR SLEEP     Psychiatry: Antidepressants - Serotonin Modulator Passed - 03/22/2023 12:44 PM      Passed - Valid encounter within last  6 months    Recent Outpatient Visits           5 months ago Moderate episode of recurrent major depressive disorder (HCC)   [place] [facility], MD   10 months ago Anxiety and depression   [place] [facility], Lauris Poag, MD   1 year ago Moderate episode of recurrent major depressive disorder [facility])   [place] [facility], MD   1 year ago Moderate episode of recurrent major depressive disorder [facility])   [place] [facility], Lauris Poag, MD   2 years ago Moderate episode of recurrent major depressive disorder [facility])   [place] [facility] at [facility], MD

## 2023-03-22 NOTE — Telephone Encounter (Signed)
 Requested medication (s) are due for refill today: Yes  Requested medication (s) are on the active medication list: Yes  Last refill:  09/27/22  Future visit scheduled: No  Notes to clinic:  Unable to refill per protocol due to failed labs, no updated results.      Requested Prescriptions  Pending Prescriptions Disp Refills   sertraline (ZOLOFT) 100 MG tablet 90 tablet 1    Sig: Take 1 tablet (100 mg total) by mouth at bedtime.     Psychiatry:  Antidepressants - SSRI - sertraline Failed - 03/22/2023 12:48 PM      Failed - AST in normal range and within 360 days    AST  Date Value Ref Range Status  01/14/2019 32 0 - 40 IU/L Final         Failed - ALT in normal range and within 360 days    ALT  Date Value Ref Range Status  01/14/2019 29 0 - 32 IU/L Final         Passed - Completed PHQ-2 or PHQ-9 in the last 360 days      Passed - Valid encounter within last 6 months    Recent Outpatient Visits           5 months ago Moderate episode of recurrent major depressive disorder (HCC)   [place] [facility], MD   10 months ago Anxiety and depression   [place] [facility], Lauris Poag, MD   1 year ago Moderate episode of recurrent major depressive disorder [facility])   [place] [facility] at [facility], MD   1 year ago Moderate episode of recurrent major depressive disorder [facility])   [place] [facility] at [facility], Lauris Poag, MD   2 years ago Moderate episode of recurrent major depressive disorder [facility])   [place] [facility] at [facility], Lauris Poag, MD              Signed Prescriptions Disp Refills   traZODone (DESYREL) 50 MG tablet 90 tablet 0    Sig: TAKE 1/2 TO 1 (ONE-HALF TO ONE) TABLET BY MOUTH ONCE DAILY AT BEDTIME AS NEEDED FOR SLEEP     Psychiatry: Antidepressants - Serotonin Modulator Passed - 03/22/2023 12:48 PM      Passed - Valid encounter within last  6 months    Recent Outpatient Visits           5 months ago Moderate episode of recurrent major depressive disorder (HCC)   [place] [facility], MD   10 months ago Anxiety and depression   [place] [facility], Lauris Poag, MD   1 year ago Moderate episode of recurrent major depressive disorder [facility])   [place] [facility], MD   1 year ago Moderate episode of recurrent major depressive disorder [facility])   [place] [facility] at [facility], Lauris Poag, MD   2 years ago Moderate episode of recurrent major depressive disorder [facility])   [place] [facility] at [facility], MD

## 2023-03-23 MED ORDER — SERTRALINE HCL 100 MG PO TABS: 100.0000 mg | ORAL_TABLET | Freq: Every day | ORAL | 0 refills | Status: DC

## 2023-04-26 ENCOUNTER — Telehealth: Admitting: Family Medicine

## 2023-04-26 DIAGNOSIS — F411 Generalized anxiety disorder: Secondary | ICD-10-CM | POA: Diagnosis not present

## 2023-04-26 DIAGNOSIS — F331 Major depressive disorder, recurrent, moderate: Secondary | ICD-10-CM

## 2023-04-26 MED ORDER — SERTRALINE HCL 100 MG PO TABS: 100.0000 mg | ORAL_TABLET | Freq: Every day | ORAL | 1 refills | Status: DC

## 2023-04-26 NOTE — Progress Notes (Unsigned)
 Virtual Visit via Video Note  I connected with Jean Palmer on 04/26/23 at 10:40 AM EDT by a video enabled telemedicine application and verified that I am speaking with the correct person using two identifiers.  Location: Patient: Jean Palmer Provider: [place]   I discussed the limitations of evaluation and management by telemedicine and the availability of in person appointments. The patient expressed understanding and agreed to proceed.  History of Present Illness: Patient is following up on depression/anxiety with med change. She reports that she is doing well with present management.  Observations/Objective:   Assessment and Plan: 1. Moderate episode of recurrent major depressive disorder (HCC)  - sertraline  (ZOLOFT ) 100 MG tablet; Take 1 tablet (100 mg total) by mouth at bedtime.  Dispense: 90 tablet; Refill: 1  2. Generalized anxiety disorder  - sertraline  (ZOLOFT ) 100 MG tablet; Take 1 tablet (100 mg total) by mouth at bedtime.  Dispense: 90 tablet; Refill: 1   Follow Up Instructions: 6 months OV   I discussed the assessment and treatment plan with the patient. The patient was provided an opportunity to ask questions and all were answered. The patient agreed with the plan and demonstrated an understanding of the instructions.   The patient was advised to call back or seek an in-person evaluation if the symptoms worsen or if the condition fails to improve as anticipated.  I provided 8 minutes of non-face-to-face time during this encounter.   Arlo Lama, MD

## 2023-04-27 ENCOUNTER — Encounter: Payer: Self-pay | Admitting: Family Medicine

## 2023-06-22 ENCOUNTER — Other Ambulatory Visit: Payer: Self-pay | Admitting: Family Medicine

## 2023-06-22 DIAGNOSIS — G4700 Insomnia, unspecified: Secondary | ICD-10-CM

## 2023-06-26 ENCOUNTER — Other Ambulatory Visit: Payer: Self-pay | Admitting: Family Medicine

## 2023-06-26 DIAGNOSIS — G4700 Insomnia, unspecified: Secondary | ICD-10-CM

## 2023-06-26 NOTE — Telephone Encounter (Signed)
 Copied from CRM [id number]. Topic: Clinical - Medication Refill >> Jun 26, 2023  9:23 AM [name] wrote: Medication: traZODone  (DESYREL ) 50 MG tablet  Has the patient contacted their pharmacy? Yes  This is the patient's preferred pharmacy:  [facility] [address] ([address] [place] - [address] [place] ([place]) [place] [address] Phone: [phone] Fax: [phone]  Is this the correct pharmacy for this prescription? Yes  Has the prescription been filled recently? Yes  Is the patient out of the medication? No, 2 days left.   Has the patient been seen for an appointment in the last year OR does the patient have an upcoming appointment? Yes  Can we respond through MyChart? No  Agent: Please be advised that Rx refills may take up to 3 business days. We ask that you follow-up with your pharmacy.

## 2023-06-27 MED ORDER — TRAZODONE HCL 50 MG PO TABS
ORAL_TABLET | ORAL | 0 refills | Status: DC
Start: 2023-06-27 — End: 2023-09-25

## 2023-06-27 NOTE — Telephone Encounter (Signed)
 Requested Prescriptions  Pending Prescriptions Disp Refills   traZODone  (DESYREL ) 50 MG tablet 90 tablet 0    Sig: TAKE 1/2 TO 1 (ONE-HALF TO ONE) TABLET BY MOUTH ONCE DAILY AT BEDTIME AS NEEDED FOR SLEEP     Psychiatry: Antidepressants - Serotonin Modulator Failed - 06/27/2023  4:51 PM      Failed - Valid encounter within last 6 months    Recent Outpatient Visits           2 months ago Moderate episode of recurrent major depressive disorder [facility])   [place] [facility], MD   9 months ago Moderate episode of recurrent major depressive disorder [facility])   [place] [facility], MD   1 year ago Anxiety and depression   [place] [facility] [place], MD   1 year ago Moderate episode of recurrent major depressive disorder [facility])   [place] [facility], MD   1 year ago Moderate episode of recurrent major depressive disorder [facility])   [place] [facility], MD

## 2023-09-13 ENCOUNTER — Telehealth: Payer: Self-pay

## 2023-09-13 NOTE — Telephone Encounter (Signed)
 Copied from CRM [id number]. Topic: Clinical - Medical Advice >> Sep 13, 2023 11:17 AM Antwanette L wrote: Reason for CRM: Pt has a 6 month f/u on 9/15 at 1:40pm w/ Dr. Tanda. The pt wants to know if she has to have a lab work? Pt is requesting a callback at [phone]

## 2023-09-15 NOTE — Telephone Encounter (Signed)
 Called pt to advise. No answer, LVM letting her know she will not need to fast.

## 2023-09-18 ENCOUNTER — Encounter: Payer: Self-pay | Admitting: Family Medicine

## 2023-09-18 ENCOUNTER — Ambulatory Visit: Admitting: Family Medicine

## 2023-09-18 VITALS — BP 144/92 | HR 93 | Ht 62.75 in | Wt 169.2 lb

## 2023-09-18 DIAGNOSIS — G4700 Insomnia, unspecified: Secondary | ICD-10-CM | POA: Diagnosis not present

## 2023-09-18 DIAGNOSIS — F331 Major depressive disorder, recurrent, moderate: Secondary | ICD-10-CM

## 2023-09-18 DIAGNOSIS — R030 Elevated blood-pressure reading, without diagnosis of hypertension: Secondary | ICD-10-CM | POA: Diagnosis not present

## 2023-09-18 DIAGNOSIS — F411 Generalized anxiety disorder: Secondary | ICD-10-CM | POA: Diagnosis not present

## 2023-09-18 MED ORDER — SERTRALINE HCL 100 MG PO TABS
150.0000 mg | ORAL_TABLET | Freq: Every day | ORAL | 1 refills | Status: AC
Start: 2023-09-18 — End: ?

## 2023-09-20 ENCOUNTER — Encounter: Payer: Self-pay | Admitting: Family Medicine

## 2023-09-20 ENCOUNTER — Other Ambulatory Visit: Payer: Self-pay | Admitting: Family Medicine

## 2023-09-20 DIAGNOSIS — G4700 Insomnia, unspecified: Secondary | ICD-10-CM

## 2023-09-20 NOTE — Progress Notes (Signed)
 Established Patient Office Visit  Subjective    Patient ID: Jean Palmer, female    DOB: 07-25-1965  Age: 58 y.o. MRN: [id number]  CC:  Chief Complaint  Patient presents with   Medical Management of Chronic Issues    HPI Jean Palmer presents for routine follow up of chronic med issues including hypertension and mood disorder. She would like to increase her zoloft  at this time.  Patient reports med compliance and denies acute complaints.   Outpatient Encounter Medications as of 09/18/2023  Medication Sig   Melatonin 10 MG CHEW Chew 10 mg by mouth at bedtime.   Multiple Vitamins-Minerals (MULTIVITAMIN ADULTS) TABS Take by mouth.   naproxen sodium (ALEVE) 220 MG tablet Take 220 mg by mouth.   traZODone  (DESYREL ) 50 MG tablet TAKE 1/2 TO 1 (ONE-HALF TO ONE) TABLET BY MOUTH ONCE DAILY AT BEDTIME AS NEEDED FOR SLEEP   [DISCONTINUED] sertraline  (ZOLOFT ) 100 MG tablet Take 1 tablet (100 mg total) by mouth at bedtime.   Misc Natural Products (OSTEO BI-FLEX JOINT SHIELD PO) Take by mouth. (Patient not taking: Reported on 09/18/2023)   sertraline  (ZOLOFT ) 100 MG tablet Take 1.5 tablets (150 mg total) by mouth at bedtime.   tolterodine  (DETROL ) 1 MG tablet Take 1 tablet (1 mg total) by mouth 2 (two) times daily. (Patient not taking: Reported on 09/18/2023)   No facility-administered encounter medications on file as of 09/18/2023.    Past Medical History:  Diagnosis Date   Alcohol abuse    Scoliosis     Past Surgical History:  Procedure Laterality Date   SPINAL FUSION     THERAPEUTIC ABORTION      Family History  Adopted: Yes    Social History   Socioeconomic History   Marital status: Single    Spouse name: Not on file   Number of children: Not on file   Years of education: Not on file   Highest education level: Not on file  Occupational History   Not on file  Tobacco Use   Smoking status: Former    Current packs/day: 0.00    Types: Cigarettes    Quit date: 05/03/2021     Years since quitting: 2.3   Smokeless tobacco: Never  Vaping Use   Vaping status: Never Used  Substance and Sexual Activity   Alcohol use: Not Currently   Drug use: Not Currently   Sexual activity: Not on file  Other Topics Concern   Not on file  Social History Narrative   Not on file   Social Drivers of Health   Financial Resource Strain: Medium Risk (04/27/2022)   Overall Financial Resource Strain (CARDIA)    Difficulty of Paying Living Expenses: Somewhat hard  Food Insecurity: No Food Insecurity (04/27/2022)   Hunger Vital Sign    Worried About Running Out of Food in the Last Year: Never true    Ran Out of Food in the Last Year: Never true  Transportation Needs: No Transportation Needs (04/27/2022)   PRAPARE - Administrator, Civil Service (Medical): No    Lack of Transportation (Non-Medical): No  Physical Activity: Insufficiently Active (04/27/2022)   Exercise Vital Sign    Days of Exercise per Week: 3 days    Minutes of Exercise per Session: 20 min  Stress: No Stress Concern Present (04/27/2022)   [employer] of Occupational Health - Occupational Stress Questionnaire    Feeling of Stress : Not at all  Social Connections: Unknown (04/27/2022)  Social Connection and Isolation Panel    Frequency of Communication with Friends and Family: Three times a week    Frequency of Social Gatherings with Friends and Family: Three times a week    Attends Religious Services: More than 4 times per year    Active Member of Clubs or Organizations: No    Attends Banker Meetings: Never    Marital Status: Not on file  Intimate Partner Violence: Not At Risk (04/27/2022)   Humiliation, Afraid, Rape, and Kick questionnaire    Fear of Current or Ex-Partner: No    Emotionally Abused: No    Physically Abused: No    Sexually Abused: No    Review of Systems  All other systems reviewed and are negative.       Objective    BP (!) 144/92   Pulse 93   Ht 5'  2.75 (1.594 m)   Wt 169 lb 3.2 oz (76.7 kg)   SpO2 94%   BMI 30.21 kg/m   Physical Exam Vitals and nursing note reviewed.  Constitutional:      General: She is not in acute distress. Cardiovascular:     Rate and Rhythm: Normal rate and regular rhythm.  Pulmonary:     Effort: Pulmonary effort is normal.     Breath sounds: Normal breath sounds.  Neurological:     General: No focal deficit present.     Mental Status: She is alert and oriented to person, place, and time.  Psychiatric:        Mood and Affect: Mood normal.        Behavior: Behavior normal.         Assessment & Plan:   Elevated blood pressure reading in office without diagnosis of hypertension  Moderate episode of recurrent major depressive disorder (HCC) -     Sertraline  HCl; Take 1.5 tablets (150 mg total) by mouth at bedtime.  Dispense: 135 tablet; Refill: 1  Generalized anxiety disorder -     Sertraline  HCl; Take 1.5 tablets (150 mg total) by mouth at bedtime.  Dispense: 135 tablet; Refill: 1  Insomnia, unspecified type     Return in about 6 months (around 03/17/2024) for follow up, chronic med issues.   Tanda Raguel SQUIBB, MD

## 2023-09-25 ENCOUNTER — Other Ambulatory Visit: Payer: Self-pay | Admitting: Family Medicine

## 2023-09-25 DIAGNOSIS — G4700 Insomnia, unspecified: Secondary | ICD-10-CM

## 2023-09-25 NOTE — Telephone Encounter (Unsigned)
 Copied from CRM [id number]. Topic: Clinical - Medication Refill >> Sep 25, 2023 10:31 AM Carlatta H wrote: Medication: traZODone  (DESYREL ) 50 MG table  Has the patient contacted their pharmacy? No (Agent: If no, request that the patient contact the pharmacy for the refill. If patient does not wish to contact the pharmacy document the reason why and proceed with request.) (Agent: If yes, when and what did the pharmacy advise?)  This is the patient's preferred pharmacy:  [facility] [id number] ([address] [place] - [address] [place] ([place]) [place] [address] Phone: [phone] Fax: [phone]  Is this the correct pharmacy for this prescription? Yes If no, delete pharmacy and type the correct one.   Has the prescription been filled recently? No  Is the patient out of the medication? Yes  Has the patient been seen for an appointment in the last year OR does the patient have an upcoming appointment? Yes  Can we respond through MyChart? No  Agent: Please be advised that Rx refills may take up to 3 business days. We ask that you follow-up with your pharmacy.

## 2023-09-26 MED ORDER — TRAZODONE HCL 50 MG PO TABS: ORAL_TABLET | ORAL | 0 refills | Status: DC

## 2023-09-26 NOTE — Telephone Encounter (Signed)
 Requested Prescriptions  Pending Prescriptions Disp Refills   traZODone  (DESYREL ) 50 MG tablet 90 tablet 0    Sig: TAKE 1/2 TO 1 (ONE-HALF TO ONE) TABLET BY MOUTH ONCE DAILY AT BEDTIME AS NEEDED FOR SLEEP     Psychiatry: Antidepressants - Serotonin Modulator Passed - 09/26/2023 11:38 AM      Passed - Valid encounter within last 6 months    Recent Outpatient Visits           1 week ago Moderate episode of recurrent major depressive disorder (HCC)   [place] [facility], Raguel, MD   5 months ago Moderate episode of recurrent major depressive disorder [facility])   [place] [facility], Raguel, MD   12 months ago Moderate episode of recurrent major depressive disorder [facility])   [place] [facility], MD   1 year ago Anxiety and depression   [place] [facility], Raguel, MD   1 year ago Moderate episode of recurrent major depressive disorder [facility])    [facility], MD

## 2023-12-18 ENCOUNTER — Other Ambulatory Visit: Payer: Self-pay | Admitting: Family Medicine

## 2023-12-18 DIAGNOSIS — G4700 Insomnia, unspecified: Secondary | ICD-10-CM

## 2023-12-22 ENCOUNTER — Other Ambulatory Visit: Payer: Self-pay | Admitting: Family Medicine

## 2023-12-22 DIAGNOSIS — G4700 Insomnia, unspecified: Secondary | ICD-10-CM

## 2023-12-22 NOTE — Telephone Encounter (Signed)
 Copied from CRM #[id number]. Topic: Clinical - Medication Question >> Dec 22, 2023  1:15 PM Shanda MATSU wrote: Reason for CRM: Patient is calling back to check status of med refill for med, traZODone  (DESYREL ) 50 MG tablet, adv patient that I show it ws sent to pharmacy, [id number] - [place] ([place]),  - [address] on 87/82/7974 @ 89:66jf.  Patient then wanted to let provider know that increasing med, sertraline  (ZOLOFT ) 100 MG tablet to a dosage of 1.5 seems to be helping her symptoms.

## 2023-12-22 NOTE — Telephone Encounter (Signed)
Filled 12/17

## 2024-03-18 ENCOUNTER — Ambulatory Visit: Admitting: Family Medicine
